# Patient Record
Sex: Female | Born: 1980
Health system: Southern US, Community
[De-identification: ages and names within clinical notes are randomized; demographics above are authoritative.]

## PROBLEM LIST (undated history)

## (undated) ENCOUNTER — Ambulatory Visit: Admission: EM

## (undated) DIAGNOSIS — F419 Anxiety disorder, unspecified: Secondary | ICD-10-CM

## (undated) DIAGNOSIS — L309 Dermatitis, unspecified: Secondary | ICD-10-CM

## (undated) DIAGNOSIS — A389 Scarlet fever, uncomplicated: Secondary | ICD-10-CM

## (undated) DIAGNOSIS — F329 Major depressive disorder, single episode, unspecified: Secondary | ICD-10-CM

## (undated) DIAGNOSIS — T7840XA Allergy, unspecified, initial encounter: Secondary | ICD-10-CM

## (undated) DIAGNOSIS — R011 Cardiac murmur, unspecified: Secondary | ICD-10-CM

## (undated) DIAGNOSIS — F4310 Post-traumatic stress disorder, unspecified: Secondary | ICD-10-CM

## (undated) DIAGNOSIS — F319 Bipolar disorder, unspecified: Secondary | ICD-10-CM

## (undated) DIAGNOSIS — F32A Depression, unspecified: Secondary | ICD-10-CM

## (undated) DIAGNOSIS — E785 Hyperlipidemia, unspecified: Secondary | ICD-10-CM

## (undated) HISTORY — DX: Allergy, unspecified, initial encounter: T78.40XA

## (undated) HISTORY — DX: Dermatitis, unspecified: L30.9

## (undated) HISTORY — DX: Cardiac murmur, unspecified: R01.1

## (undated) HISTORY — PX: WISDOM TOOTH EXTRACTION: SHX21

## (undated) HISTORY — DX: Anxiety disorder, unspecified: F41.9

## (undated) HISTORY — DX: Depression, unspecified: F32.A

## (undated) HISTORY — DX: Hyperlipidemia, unspecified: E78.5

## (undated) HISTORY — DX: Post-traumatic stress disorder, unspecified: F43.10

## (undated) HISTORY — DX: Scarlet fever, uncomplicated: A38.9

## (undated) HISTORY — DX: Bipolar disorder, unspecified: F31.9

---
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
## 2006-11-18 ENCOUNTER — Emergency Department (HOSPITAL_COMMUNITY): Admission: EM | Admit: 2006-11-18 | Discharge: 2006-11-18 | Payer: Self-pay | Admitting: Emergency Medicine

## 2007-07-06 ENCOUNTER — Emergency Department (HOSPITAL_COMMUNITY): Admission: EM | Admit: 2007-07-06 | Discharge: 2007-07-06 | Payer: Self-pay | Admitting: Emergency Medicine

## 2008-03-05 ENCOUNTER — Emergency Department (HOSPITAL_COMMUNITY): Admission: EM | Admit: 2008-03-05 | Discharge: 2008-03-06 | Payer: Self-pay | Admitting: Emergency Medicine

## 2009-02-06 ENCOUNTER — Emergency Department (HOSPITAL_COMMUNITY): Admission: EM | Admit: 2009-02-06 | Discharge: 2009-02-06 | Payer: Self-pay | Admitting: Emergency Medicine

## 2009-03-15 ENCOUNTER — Emergency Department (HOSPITAL_COMMUNITY): Admission: EM | Admit: 2009-03-15 | Discharge: 2009-03-15 | Payer: Self-pay | Admitting: Emergency Medicine

## 2009-04-18 ENCOUNTER — Emergency Department (HOSPITAL_COMMUNITY): Admission: EM | Admit: 2009-04-18 | Discharge: 2009-04-18 | Payer: Self-pay | Admitting: Emergency Medicine

## 2009-08-09 ENCOUNTER — Emergency Department (HOSPITAL_COMMUNITY): Admission: EM | Admit: 2009-08-09 | Discharge: 2009-08-09 | Payer: Self-pay | Admitting: Emergency Medicine

## 2009-08-15 ENCOUNTER — Inpatient Hospital Stay (HOSPITAL_COMMUNITY): Admission: EM | Admit: 2009-08-15 | Discharge: 2009-08-16 | Payer: Self-pay | Admitting: Emergency Medicine

## 2009-08-15 ENCOUNTER — Encounter (INDEPENDENT_AMBULATORY_CARE_PROVIDER_SITE_OTHER): Payer: Self-pay

## 2010-03-21 ENCOUNTER — Emergency Department (HOSPITAL_COMMUNITY)
Admission: EM | Admit: 2010-03-21 | Discharge: 2010-03-22 | Payer: Self-pay | Source: Home / Self Care | Admitting: Emergency Medicine

## 2010-07-24 LAB — WET PREP, GENITAL
Trich, Wet Prep: NONE SEEN
Yeast Wet Prep HPF POC: NONE SEEN

## 2010-07-24 LAB — CBC
MCH: 30 pg (ref 26.0–34.0)
MCV: 85.5 fL (ref 78.0–100.0)
Platelets: 228 10*3/uL (ref 150–400)
RDW: 13.5 % (ref 11.5–15.5)
WBC: 4.4 10*3/uL (ref 4.0–10.5)

## 2010-07-24 LAB — DIFFERENTIAL
Basophils Absolute: 0 10*3/uL (ref 0.0–0.1)
Eosinophils Absolute: 0.1 10*3/uL (ref 0.0–0.7)
Eosinophils Relative: 1 % (ref 0–5)
Lymphocytes Relative: 53 % — ABNORMAL HIGH (ref 12–46)

## 2010-07-24 LAB — URINALYSIS, ROUTINE W REFLEX MICROSCOPIC
Ketones, ur: NEGATIVE mg/dL
Nitrite: NEGATIVE
Protein, ur: NEGATIVE mg/dL

## 2010-07-24 LAB — BASIC METABOLIC PANEL
BUN: 6 mg/dL (ref 6–23)
Chloride: 107 mEq/L (ref 96–112)
Creatinine, Ser: 0.75 mg/dL (ref 0.4–1.2)

## 2010-07-24 LAB — HEPATIC FUNCTION PANEL
ALT: 11 U/L (ref 0–35)
Albumin: 3.9 g/dL (ref 3.5–5.2)
Alkaline Phosphatase: 53 U/L (ref 39–117)
Total Protein: 7 g/dL (ref 6.0–8.3)

## 2010-07-24 LAB — LIPASE, BLOOD: Lipase: 33 U/L (ref 11–59)

## 2010-08-01 LAB — DIFFERENTIAL
Basophils Absolute: 0 10*3/uL (ref 0.0–0.1)
Eosinophils Relative: 1 % (ref 0–5)
Lymphocytes Relative: 28 % (ref 12–46)
Lymphs Abs: 2.4 10*3/uL (ref 0.7–4.0)
Monocytes Absolute: 0.5 10*3/uL (ref 0.1–1.0)
Monocytes Relative: 6 % (ref 3–12)

## 2010-08-01 LAB — POCT PREGNANCY, URINE: Preg Test, Ur: NEGATIVE

## 2010-08-01 LAB — COMPREHENSIVE METABOLIC PANEL
AST: 21 U/L (ref 0–37)
Albumin: 3.8 g/dL (ref 3.5–5.2)
Chloride: 104 mEq/L (ref 96–112)
Creatinine, Ser: 0.84 mg/dL (ref 0.4–1.2)
GFR calc Af Amer: >60 mL/min (ref >60)
Total Bilirubin: 0.5 mg/dL (ref 0.3–1.2)
Total Protein: 7.2 g/dL (ref 6.0–8.3)

## 2010-08-01 LAB — GLUCOSE, CAPILLARY: Glucose-Capillary: 128 mg/dL — ABNORMAL HIGH (ref 70–99)

## 2010-08-01 LAB — URINALYSIS, ROUTINE W REFLEX MICROSCOPIC
Bilirubin Urine: NEGATIVE
Glucose, UA: NEGATIVE mg/dL
Hgb urine dipstick: NEGATIVE
Specific Gravity, Urine: 1.028 (ref 1.005–1.030)
pH: 8 (ref 5.0–8.0)

## 2010-08-01 LAB — CBC
MCV: 91.8 fL (ref 78.0–100.0)
Platelets: 219 10*3/uL (ref 150–400)
WBC: 8.5 10*3/uL (ref 4.0–10.5)

## 2010-08-01 LAB — URINE MICROSCOPIC-ADD ON

## 2010-08-05 LAB — DIFFERENTIAL
Eosinophils Absolute: 0.1 10*3/uL (ref 0.0–0.7)
Lymphocytes Relative: 56 % — ABNORMAL HIGH (ref 12–46)
Lymphs Abs: 3.1 10*3/uL (ref 0.7–4.0)
Neutrophils Relative %: 33 % — ABNORMAL LOW (ref 43–77)

## 2010-08-05 LAB — URINALYSIS, ROUTINE W REFLEX MICROSCOPIC
Bilirubin Urine: NEGATIVE
Nitrite: NEGATIVE
Specific Gravity, Urine: 1.034 — ABNORMAL HIGH (ref 1.005–1.030)
pH: 6 (ref 5.0–8.0)

## 2010-08-05 LAB — BASIC METABOLIC PANEL
BUN: 13 mg/dL (ref 6–23)
Creatinine, Ser: 0.8 mg/dL (ref 0.4–1.2)
GFR calc non Af Amer: >60 mL/min (ref >60)
Glucose, Bld: 97 mg/dL (ref 70–99)

## 2010-08-05 LAB — HEPATIC FUNCTION PANEL
ALT: 14 U/L (ref 0–35)
Albumin: 3.7 g/dL (ref 3.5–5.2)
Alkaline Phosphatase: 48 U/L (ref 39–117)
Total Protein: 7 g/dL (ref 6.0–8.3)

## 2010-08-05 LAB — LIPASE, BLOOD: Lipase: 32 U/L (ref 11–59)

## 2010-08-05 LAB — POCT PREGNANCY, URINE: Preg Test, Ur: NEGATIVE

## 2010-08-05 LAB — CBC
MCV: 91.7 fL (ref 78.0–100.0)
Platelets: 213 10*3/uL (ref 150–400)
RDW: 14.1 % (ref 11.5–15.5)
WBC: 5.5 10*3/uL (ref 4.0–10.5)

## 2010-08-14 LAB — COMPREHENSIVE METABOLIC PANEL
ALT: 18 U/L (ref 0–35)
AST: 19 U/L (ref 0–37)
Albumin: 3.8 g/dL (ref 3.5–5.2)
Alkaline Phosphatase: 43 U/L (ref 39–117)
CO2: 25 mEq/L (ref 19–32)
Chloride: 106 mEq/L (ref 96–112)
GFR calc Af Amer: >60 mL/min (ref >60)
GFR calc non Af Amer: >60 mL/min (ref >60)
Potassium: 3.7 mEq/L (ref 3.5–5.1)
Sodium: 135 mEq/L (ref 135–145)
Total Bilirubin: 0.5 mg/dL (ref 0.3–1.2)

## 2010-08-14 LAB — URINALYSIS, ROUTINE W REFLEX MICROSCOPIC
Bilirubin Urine: NEGATIVE
Glucose, UA: NEGATIVE mg/dL
Hgb urine dipstick: NEGATIVE
Protein, ur: NEGATIVE mg/dL
Urobilinogen, UA: 0.2 mg/dL (ref 0.0–1.0)

## 2010-08-14 LAB — DIFFERENTIAL
Basophils Absolute: 0 10*3/uL (ref 0.0–0.1)
Eosinophils Absolute: 0.1 10*3/uL (ref 0.0–0.7)
Eosinophils Relative: 2 % (ref 0–5)
Monocytes Absolute: 0.5 10*3/uL (ref 0.1–1.0)

## 2010-08-14 LAB — SEDIMENTATION RATE: Sed Rate: 15 mm/hr (ref 0–22)

## 2010-08-14 LAB — CBC
MCV: 91.6 fL (ref 78.0–100.0)
RBC: 4.12 MIL/uL (ref 3.87–5.11)
WBC: 5.6 10*3/uL (ref 4.0–10.5)

## 2011-02-04 LAB — POCT URINALYSIS DIP (DEVICE)
Glucose, UA: NEGATIVE
Hgb urine dipstick: NEGATIVE
Nitrite: NEGATIVE
pH: 7

## 2011-02-04 LAB — POCT PREGNANCY, URINE: Operator id: 235561

## 2011-05-14 HISTORY — PX: CHOLECYSTECTOMY: SHX55

## 2014-02-19 ENCOUNTER — Encounter (HOSPITAL_COMMUNITY): Payer: Self-pay | Admitting: Emergency Medicine

## 2014-02-19 ENCOUNTER — Emergency Department (HOSPITAL_COMMUNITY): Payer: No Typology Code available for payment source

## 2014-02-19 ENCOUNTER — Emergency Department (HOSPITAL_COMMUNITY)
Admission: EM | Admit: 2014-02-19 | Discharge: 2014-02-19 | Disposition: A | Discharge status: Home / Self Care | Payer: No Typology Code available for payment source | Attending: Emergency Medicine | Admitting: Emergency Medicine

## 2014-02-19 DIAGNOSIS — S29002A Unspecified injury of muscle and tendon of back wall of thorax, initial encounter: Secondary | ICD-10-CM | POA: Diagnosis present

## 2014-02-19 DIAGNOSIS — Z7951 Long term (current) use of inhaled steroids: Secondary | ICD-10-CM | POA: Diagnosis not present

## 2014-02-19 DIAGNOSIS — S335XXA Sprain of ligaments of lumbar spine, initial encounter: Secondary | ICD-10-CM | POA: Insufficient documentation

## 2014-02-19 DIAGNOSIS — Z3202 Encounter for pregnancy test, result negative: Secondary | ICD-10-CM | POA: Insufficient documentation

## 2014-02-19 DIAGNOSIS — Z720 Tobacco use: Secondary | ICD-10-CM | POA: Diagnosis not present

## 2014-02-19 DIAGNOSIS — Y92410 Unspecified street and highway as the place of occurrence of the external cause: Secondary | ICD-10-CM | POA: Diagnosis not present

## 2014-02-19 DIAGNOSIS — Y9389 Activity, other specified: Secondary | ICD-10-CM | POA: Insufficient documentation

## 2014-02-19 DIAGNOSIS — Z91040 Latex allergy status: Secondary | ICD-10-CM | POA: Insufficient documentation

## 2014-02-19 DIAGNOSIS — T148XXA Other injury of unspecified body region, initial encounter: Secondary | ICD-10-CM

## 2014-02-19 LAB — POC URINE PREG, ED: Preg Test, Ur: NEGATIVE

## 2014-02-19 MED ORDER — NAPROXEN 500 MG PO TABS: 500.0000 mg | ORAL_TABLET | Freq: Two times a day (BID) | ORAL | Status: DC

## 2014-02-19 MED ORDER — IBUPROFEN 800 MG PO TABS
800.0000 mg | ORAL_TABLET | Freq: Once | ORAL | Status: AC
  Administered 2014-02-19: 800 mg via ORAL
  Filled 2014-02-19: qty 1

## 2014-02-19 MED ORDER — METHOCARBAMOL 500 MG PO TABS: 500.0000 mg | ORAL_TABLET | Freq: Two times a day (BID) | ORAL | Status: DC
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
## 2014-02-19 NOTE — ED Notes (Signed)
MD at bedside. 
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
## 2014-02-19 NOTE — Discharge Instructions (Signed)
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
## 2014-06-20 ENCOUNTER — Emergency Department (HOSPITAL_COMMUNITY): Payer: BLUE CROSS/BLUE SHIELD

## 2014-06-20 ENCOUNTER — Encounter (HOSPITAL_COMMUNITY): Payer: Self-pay | Admitting: Emergency Medicine

## 2014-06-20 ENCOUNTER — Emergency Department (HOSPITAL_COMMUNITY)
Admission: EM | Admit: 2014-06-20 | Discharge: 2014-06-20 | Disposition: A | Discharge status: Home / Self Care | Payer: BLUE CROSS/BLUE SHIELD | Attending: Emergency Medicine | Admitting: Emergency Medicine

## 2014-06-20 DIAGNOSIS — R202 Paresthesia of skin: Secondary | ICD-10-CM | POA: Insufficient documentation

## 2014-06-20 DIAGNOSIS — R413 Other amnesia: Secondary | ICD-10-CM | POA: Diagnosis not present

## 2014-06-20 DIAGNOSIS — H538 Other visual disturbances: Secondary | ICD-10-CM | POA: Insufficient documentation

## 2014-06-20 DIAGNOSIS — R51 Headache: Secondary | ICD-10-CM | POA: Insufficient documentation

## 2014-06-20 DIAGNOSIS — R531 Weakness: Secondary | ICD-10-CM | POA: Insufficient documentation

## 2014-06-20 DIAGNOSIS — Z91040 Latex allergy status: Secondary | ICD-10-CM | POA: Insufficient documentation

## 2014-06-20 DIAGNOSIS — Z792 Long term (current) use of antibiotics: Secondary | ICD-10-CM | POA: Diagnosis not present

## 2014-06-20 DIAGNOSIS — R519 Headache, unspecified: Secondary | ICD-10-CM

## 2014-06-20 DIAGNOSIS — H539 Unspecified visual disturbance: Secondary | ICD-10-CM

## 2014-06-20 DIAGNOSIS — R4789 Other speech disturbances: Secondary | ICD-10-CM | POA: Insufficient documentation

## 2014-06-20 NOTE — ED Notes (Signed)
PA at bedside.

## 2014-06-20 NOTE — ED Notes (Signed)
Patient returned from [place]. Patient waiting for results.

## 2014-06-20 NOTE — Discharge Instructions (Signed)
Take Excedrin migraine daily. Follow up with neurologist. Return if worsening symptoms or do not go away.    Migraine Headache A migraine headache is an intense, throbbing pain on one or both sides of your head. A migraine can last for 30 minutes to several hours. CAUSES  The exact cause of a migraine headache is not always known. However, a migraine may be caused when nerves in the brain become irritated and release chemicals that cause inflammation. This causes pain. Certain things may also trigger migraines, such as:  Alcohol.  Smoking.  Stress.  Menstruation.  Aged cheeses.  Foods or drinks that contain nitrates, glutamate, aspartame, or tyramine.  Lack of sleep.  Chocolate.  Caffeine.  Hunger.  Physical exertion.  Fatigue.  Medicines used to treat chest pain (nitroglycerine), birth control pills, estrogen, and some blood pressure medicines. SIGNS AND SYMPTOMS  Pain on one or both sides of your head.  Pulsating or throbbing pain.  Severe pain that prevents daily activities.  Pain that is aggravated by any physical activity.  Nausea, vomiting, or both.  Dizziness.  Pain with exposure to bright lights, loud noises, or activity.  General sensitivity to bright lights, loud noises, or smells. Before you get a migraine, you may get warning signs that a migraine is coming (aura). An aura may include:  Seeing flashing lights.  Seeing bright spots, halos, or zigzag lines.  Having tunnel vision or blurred vision.  Having feelings of numbness or tingling.  Having trouble talking.  Having muscle weakness. DIAGNOSIS  A migraine headache is often diagnosed based on:  Symptoms.  Physical exam.  A CT scan or MRI of your head. These imaging tests cannot diagnose migraines, but they can help rule out other causes of headaches. TREATMENT Medicines may be given for pain and nausea. Medicines can also be given to help prevent recurrent migraines.  HOME CARE  INSTRUCTIONS  Only take over-the-counter or prescription medicines for pain or discomfort as directed by your health care provider. The use of long-term narcotics is not recommended.  Lie down in a dark, quiet room when you have a migraine.  Keep a journal to find out what may trigger your migraine headaches. For example, write down:  What you eat and drink.  How much sleep you get.  Any change to your diet or medicines.  Limit alcohol consumption.  Quit smoking if you smoke.  Get 7-9 hours of sleep, or as recommended by your health care provider.  Limit stress.  Keep lights dim if bright lights bother you and make your migraines worse. SEEK IMMEDIATE MEDICAL CARE IF:   Your migraine becomes severe.  You have a fever.  You have a stiff neck.  You have vision loss.  You have muscular weakness or loss of muscle control.  You start losing your balance or have trouble walking.  You feel faint or pass out.  You have severe symptoms that are different from your first symptoms. MAKE SURE YOU:   Understand these instructions.  Will watch your condition.  Will get help right away if you are not doing well or get worse. Document Released: 04/29/2005 Document Revised: 09/13/2013 Document Reviewed: 01/04/2013 [facility] Patient Information 2015 [employer], [place]. This information is not intended to replace advice given to you by your health care provider. Make sure you discuss any questions you have with your health care provider.
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
## 2014-06-20 NOTE — ED Notes (Signed)
Patient transported to CT 

## 2014-06-20 NOTE — ED Notes (Signed)
PA at the bedside.

## 2014-07-18 ENCOUNTER — Ambulatory Visit (INDEPENDENT_AMBULATORY_CARE_PROVIDER_SITE_OTHER): Payer: BLUE CROSS/BLUE SHIELD | Admitting: Neurology

## 2014-07-18 ENCOUNTER — Encounter: Payer: Self-pay | Admitting: Neurology

## 2014-07-18 VITALS — BP 122/72 | HR 77 | Ht 68.0 in | Wt 196.0 lb

## 2014-07-18 DIAGNOSIS — R413 Other amnesia: Secondary | ICD-10-CM | POA: Diagnosis not present

## 2014-07-18 DIAGNOSIS — R51 Headache: Secondary | ICD-10-CM | POA: Diagnosis not present

## 2014-07-18 DIAGNOSIS — R5382 Chronic fatigue, unspecified: Secondary | ICD-10-CM

## 2014-07-18 DIAGNOSIS — G441 Vascular headache, not elsewhere classified: Secondary | ICD-10-CM

## 2014-07-18 DIAGNOSIS — R635 Abnormal weight gain: Secondary | ICD-10-CM | POA: Diagnosis not present

## 2014-07-18 DIAGNOSIS — H539 Unspecified visual disturbance: Secondary | ICD-10-CM

## 2014-07-18 DIAGNOSIS — F0781 Postconcussional syndrome: Secondary | ICD-10-CM | POA: Insufficient documentation

## 2014-07-18 DIAGNOSIS — R5383 Other fatigue: Secondary | ICD-10-CM | POA: Insufficient documentation

## 2014-07-18 DIAGNOSIS — R519 Headache, unspecified: Secondary | ICD-10-CM | POA: Insufficient documentation

## 2014-07-18 MED ORDER — NORTRIPTYLINE HCL 25 MG PO CAPS: 25.0000 mg | ORAL_CAPSULE | Freq: Every day | ORAL | Status: DC
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
## 2014-07-18 NOTE — Patient Instructions (Signed)
Overall you are doing fairly well but I do want to suggest a few things today:   Remember to drink plenty of fluid, eat healthy meals and do not skip any meals. Try to eat protein with a every meal and eat a healthy snack such as fruit or nuts in between meals. Try to keep a regular sleep-wake schedule and try to exercise daily, particularly in the form of walking, 20-30 minutes a day, if you can.   As far as your medications are concerned, I would like to suggest; Nortriptyline in the evenings  As far as diagnostic testing: MRI of the brain  I would like to see you back in 3 months, sooner if we need to. Please call us with any interim questions, concerns, problems, updates or refill requests.   Please also call us for any test results so we can go over those with you on the phone.  My clinical assistant and will answer any of your questions and relay your messages to me and also relay most of my messages to you.   Our phone number is [phone]. We also have an after hours call service for urgent matters and there is a physician on-call for urgent questions. For any emergencies you know to call 911 or go to the nearest emergency room

## 2014-07-19 ENCOUNTER — Telehealth: Payer: Self-pay | Admitting: *Deleted

## 2014-07-19 LAB — COMPREHENSIVE METABOLIC PANEL
ALK PHOS: 65 IU/L (ref 39–117)
ALT: 9 IU/L (ref 0–32)
AST: 12 IU/L (ref 0–40)
Albumin/Globulin Ratio: 1.7 (ref 1.1–2.5)
Albumin: 4 g/dL (ref 3.5–5.5)
BILIRUBIN TOTAL: 0.2 mg/dL (ref 0.0–1.2)
BUN / CREAT RATIO: 14 (ref 8–20)
BUN: 10 mg/dL (ref 6–20)
CO2: 22 mmol/L (ref 18–29)
Calcium: 8.8 mg/dL (ref 8.7–10.2)
Chloride: 104 mmol/L (ref 97–108)
Creatinine, Ser: 0.72 mg/dL (ref 0.57–1.00)
GFR, EST AFRICAN AMERICAN: 127 mL/min/{1.73_m2} (ref >59)
GFR, EST NON AFRICAN AMERICAN: 110 mL/min/{1.73_m2} (ref >59)
GLOBULIN, TOTAL: 2.3 g/dL (ref 1.5–4.5)
Glucose: 91 mg/dL (ref 65–99)
Potassium: 4.6 mmol/L (ref 3.5–5.2)
SODIUM: 139 mmol/L (ref 134–144)
Total Protein: 6.3 g/dL (ref 6.0–8.5)

## 2014-07-19 LAB — THYROID PANEL WITH TSH
FREE THYROXINE INDEX: 2.2 (ref 1.2–4.9)
T3 UPTAKE RATIO: 27 % (ref 24–39)
T4, Total: 8 ug/dL (ref 4.5–12.0)
TSH: 3.54 u[IU]/mL (ref 0.450–4.500)

## 2014-07-19 NOTE — Telephone Encounter (Signed)
Talked with patient about normal lab results. Pt verbalized understanding.  

## 2014-07-20 ENCOUNTER — Ambulatory Visit (INDEPENDENT_AMBULATORY_CARE_PROVIDER_SITE_OTHER): Payer: BLUE CROSS/BLUE SHIELD

## 2014-07-20 DIAGNOSIS — H539 Unspecified visual disturbance: Secondary | ICD-10-CM | POA: Diagnosis not present

## 2014-07-20 DIAGNOSIS — R51 Headache: Secondary | ICD-10-CM

## 2014-07-20 DIAGNOSIS — R413 Other amnesia: Secondary | ICD-10-CM

## 2014-07-20 DIAGNOSIS — R519 Headache, unspecified: Secondary | ICD-10-CM

## 2014-07-25 ENCOUNTER — Telehealth: Payer: Self-pay | Admitting: Neurology

## 2014-07-25 NOTE — Telephone Encounter (Signed)
Called patient, left message. The MRI of her brain was unremarkable, we did see an incidental chiari malformation with normal upper cervical cord. Some non-specific areas of gliosis. Asked patient to call back if she wanted to discuss. Thanks.

## 2014-07-26 NOTE — Telephone Encounter (Signed)
Patient returning call regarding MRI results.  Please return call and advise.

## 2014-07-27 NOTE — Telephone Encounter (Signed)
Patient not home, left message. Will try back tomorrow.

## 2014-07-28 ENCOUNTER — Telehealth: Payer: Self-pay | Admitting: *Deleted

## 2014-07-28 NOTE — Telephone Encounter (Signed)
Talked with patient and told her Dr. Jaynee Eagles will be back in the office tomorrow and will give her a call back. Pt verbalized understanding.

## 2014-07-28 NOTE — Telephone Encounter (Signed)
Patient returning call and requested a return call before 1 pm, due to works 2nd shift.

## 2014-07-29 ENCOUNTER — Other Ambulatory Visit: Payer: Self-pay | Admitting: Neurology

## 2014-07-29 MED ORDER — NORTRIPTYLINE HCL 25 MG PO CAPS: 75.0000 mg | ORAL_CAPSULE | Freq: Every day | ORAL | Status: DC

## 2014-07-29 NOTE — Telephone Encounter (Signed)
Discussed MRi results. Will increase Pamelor to 50mg  qhs and if tolerated to 75mg  qhs.

## 2014-07-29 NOTE — Telephone Encounter (Signed)
Patient is calling back regarding MRI results.

## 2014-08-02 ENCOUNTER — Telehealth: Payer: Self-pay | Admitting: Neurology

## 2014-08-02 ENCOUNTER — Ambulatory Visit: Payer: Self-pay | Admitting: Neurology

## 2014-08-02 NOTE — Telephone Encounter (Signed)
Patient is calling Rx Nortriptyline 25 mg. She stated she was advised to up the dossage to 2 tablets at night. She stated this is making her very tired and sleepy and hard for her to function. Please call.

## 2014-08-03 ENCOUNTER — Encounter: Payer: Self-pay | Admitting: Neurology

## 2014-08-03 NOTE — Telephone Encounter (Signed)
Nicole [name], would you call patient and advise her to decrease to one pill in the evenings. If two pills is making her too tired, we can go back down to one x 25mg  at night. Thank you

## 2014-08-04 ENCOUNTER — Telehealth: Payer: Self-pay | Admitting: *Deleted

## 2014-08-04 NOTE — Telephone Encounter (Signed)
Left message for patient to call back. Gave [facility] phone number.

## 2014-08-04 NOTE — Telephone Encounter (Signed)
Left a message for the pt to call us back. Gave [facility] phone number and office hours.

## 2014-08-04 NOTE — Telephone Encounter (Signed)
Patient returning Nicole Dupont, RN's call, requesting return call @ [phone].

## 2014-08-04 NOTE — Telephone Encounter (Signed)
Talked with pt and advised her to take only one pill of the Nortriptyline in the evenings per Dr. Jaynee Eagles instructions. Pt verbalized understanding. Told pt to call back with further questions If needed

## 2014-10-18 ENCOUNTER — Ambulatory Visit: Payer: BLUE CROSS/BLUE SHIELD | Admitting: Neurology

## 2014-10-24 ENCOUNTER — Ambulatory Visit: Payer: BLUE CROSS/BLUE SHIELD | Admitting: Neurology

## 2014-10-25 ENCOUNTER — Encounter: Payer: Self-pay | Admitting: Neurology

## 2015-05-14 DIAGNOSIS — M359 Systemic involvement of connective tissue, unspecified: Secondary | ICD-10-CM

## 2015-05-14 HISTORY — DX: Systemic involvement of connective tissue, unspecified: M35.9

## 2015-06-08 ENCOUNTER — Telehealth: Payer: Self-pay | Admitting: *Deleted

## 2015-06-08 NOTE — Telephone Encounter (Signed)
Patient questionnaire form is on [employer].

## 2015-12-07 ENCOUNTER — Encounter (HOSPITAL_COMMUNITY): Payer: Self-pay | Admitting: Emergency Medicine

## 2016-09-11 DIAGNOSIS — M359 Systemic involvement of connective tissue, unspecified: Secondary | ICD-10-CM | POA: Insufficient documentation

## 2016-09-11 DIAGNOSIS — M629 Disorder of muscle, unspecified: Secondary | ICD-10-CM | POA: Insufficient documentation

## 2016-09-11 DIAGNOSIS — D649 Anemia, unspecified: Secondary | ICD-10-CM | POA: Insufficient documentation

## 2016-09-11 DIAGNOSIS — F32A Depression, unspecified: Secondary | ICD-10-CM | POA: Insufficient documentation

## 2016-09-11 DIAGNOSIS — K5900 Constipation, unspecified: Secondary | ICD-10-CM | POA: Insufficient documentation

## 2016-09-11 DIAGNOSIS — E7800 Pure hypercholesterolemia, unspecified: Secondary | ICD-10-CM | POA: Insufficient documentation

## 2017-05-13 HISTORY — PX: SHOULDER SURGERY: SHX246

## 2018-09-02 ENCOUNTER — Encounter: Payer: Self-pay | Admitting: Family Medicine

## 2018-09-02 ENCOUNTER — Telehealth (INDEPENDENT_AMBULATORY_CARE_PROVIDER_SITE_OTHER): Payer: No Typology Code available for payment source | Admitting: Family Medicine

## 2018-09-02 DIAGNOSIS — G8929 Other chronic pain: Secondary | ICD-10-CM

## 2018-09-02 DIAGNOSIS — F411 Generalized anxiety disorder: Secondary | ICD-10-CM

## 2018-09-02 DIAGNOSIS — M25511 Pain in right shoulder: Secondary | ICD-10-CM | POA: Diagnosis not present

## 2018-09-02 MED ORDER — MELOXICAM 15 MG PO TABS: 15.0000 mg | ORAL_TABLET | Freq: Every day | ORAL | 2 refills | Status: DC

## 2018-09-02 MED ORDER — ESCITALOPRAM OXALATE 10 MG PO TABS: 10.0000 mg | ORAL_TABLET | Freq: Every day | ORAL | 2 refills | Status: DC

## 2018-09-02 MED ORDER — GABAPENTIN 300 MG PO CAPS: 300.0000 mg | ORAL_CAPSULE | Freq: Three times a day (TID) | ORAL | 3 refills | Status: DC
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
## 2018-10-02 ENCOUNTER — Telehealth: Payer: No Typology Code available for payment source | Admitting: Family Medicine

## 2018-10-29 ENCOUNTER — Other Ambulatory Visit: Payer: Self-pay

## 2018-10-29 ENCOUNTER — Encounter: Payer: Self-pay | Admitting: Family Medicine

## 2018-10-29 ENCOUNTER — Ambulatory Visit (INDEPENDENT_AMBULATORY_CARE_PROVIDER_SITE_OTHER): Payer: No Typology Code available for payment source | Admitting: Family Medicine

## 2018-10-29 VITALS — BP 117/81 | HR 76 | Temp 98.9°F | Ht 67.0 in | Wt 211.0 lb

## 2018-10-29 DIAGNOSIS — M359 Systemic involvement of connective tissue, unspecified: Secondary | ICD-10-CM | POA: Diagnosis not present

## 2018-10-29 DIAGNOSIS — Z803 Family history of malignant neoplasm of breast: Secondary | ICD-10-CM | POA: Insufficient documentation

## 2018-10-29 DIAGNOSIS — M797 Fibromyalgia: Secondary | ICD-10-CM

## 2018-10-29 MED ORDER — DULOXETINE HCL 30 MG PO CPEP
30.0000 mg | ORAL_CAPSULE | Freq: Every day | ORAL | 0 refills | Status: DC
Start: 2018-10-29 — End: 2019-04-12

## 2018-10-29 MED ORDER — MELOXICAM 15 MG PO TABS: 15.0000 mg | ORAL_TABLET | Freq: Every day | ORAL | 2 refills | Status: DC

## 2018-10-29 NOTE — Progress Notes (Signed)
6/18/20203:03 PM  Nicole [name] 1980/06/25, 38 y.o., female [id number]  Chief Complaint  Patient presents with  . Joint Pain    stopped taking the gabapentin makes her sleepy, only taking the meloxicam for the pain. dx with unspecified connective tissue disease    HPI:   Patient is a 38 y.o. female with past medical history significant for GAD who presents today for followup  Telemedicine visit in April 2020 Right shoulder pain with paresthesias -started gabapentin Anxiety - started lexapro  Patient reports that gabapentin worked really well but made her really sleepy Struggling to wake up due to sedation Patient however reports that she was diagnosed at [facility] with unspecified connective tissue disorder Originally rx palquenil, stopped as she was getting lots viral infections Patient reports "feel that I have been in a fight", sore all over, taking gabapentin made body pain much better Patient reports fluctuating fatigue, mental fogginess meloxicam stopped helping  Stopped lexapro but stopped as she was not feeling as anxious anymore Overall doing better as long as able to stay at home  Depression screen [facility] 2/9 10/29/2018 09/02/2018  Decreased Interest 0 1  Down, Depressed, Hopeless 0 1  PHQ - 2 Score 0 2  Altered sleeping - 1  Tired, decreased energy - 1  Change in appetite - 0  Feeling bad or failure about yourself  - 0  Trouble concentrating - 1  Moving slowly or fidgety/restless - 1  Suicidal thoughts - 0  PHQ-9 Score - 6    Fall Risk  10/29/2018 09/02/2018  Falls in the past year? 0 0  Number falls in past yr: 0 -  Injury with Fall? 0 -     Allergies  Allergen Reactions  . Latex Hives  . Mangifera Indica Hives  . Strawberry Extract Hives  . Latex Rash    Prior to Admission medications   Medication Sig Start Date End Date Taking? Authorizing Provider  meloxicam (MOBIC) 15 MG tablet Take 1 tablet (15 mg total) by mouth daily. 09/02/18   Yes Rutherford Guys, MD    No past medical history on file.  Past Surgical History:  Procedure Laterality Date  . CHOLECYSTECTOMY  2013  . WISDOM TOOTH EXTRACTION  2013, 2016    Social History   Tobacco Use  . Smoking status: Current Some Day Smoker    Types: Cigarettes  . Smokeless tobacco: Never Used  Substance Use Topics  . Alcohol use: Yes    Alcohol/week: 0.0 standard drinks    Comment: occasional     Family History  Problem Relation Age of Onset  . Lupus Mother   . High Cholesterol Mother   . Diabetes Father     ROS Per hpi  OBJECTIVE:  Today's Vitals   10/29/18 1452  BP: 117/81  Pulse: 76  Temp: 98.9 F (37.2 C)  TempSrc: Oral  SpO2: 99%  Weight: 211 lb (95.7 kg)  Height: 5\' 7"  (1.702 m)   Body mass index is 33.05 kg/m.   Physical Exam Vitals signs and nursing note reviewed.  Constitutional:      Appearance: She is well-developed.  HENT:     Head: Normocephalic and atraumatic.     Mouth/Throat:     Pharynx: No oropharyngeal exudate.  Eyes:     General: No scleral icterus.    Conjunctiva/sclera: Conjunctivae normal.     Pupils: Pupils are equal, round, and reactive to light.  Neck:     Musculoskeletal: Neck supple.  Cardiovascular:  Rate and Rhythm: Normal rate and regular rhythm.     Heart sounds: Normal heart sounds. No murmur. No friction rub. No gallop.   Pulmonary:     Effort: Pulmonary effort is normal.     Breath sounds: Normal breath sounds. No wheezing or rales.  Musculoskeletal:     Comments: Mild synovitis and TTP Right ankle, no erythema or warmth, FROM, no other joints with synovitis TTP of right shoulder and wrist Other joints non tender multiple bilateral points of myofacial tenderness   Skin:    General: Skin is warm and dry.  Neurological:     Mental Status: She is alert and oriented to person, place, and time.    Fibro self-assesment scores: Widespread pan index: 11 out of 19 Symptom severity: 11 out of  12  ASSESSMENT and PLAN  1. Connective tissue disorder (HCC) Continue meloxicam, consider rheum referral - Sedimentation Rate - C-reactive protein  2. Fibromyalgia New diagnosis. Discussed with patient that sx suggestive of fibro, was doing well pain wise on gabapentin but too sedating, trial of duloxetine. Discussed gentle exercise.   Other orders - DULoxetine (CYMBALTA) 30 MG capsule; Take 1 capsule (30 mg total) by mouth daily. - meloxicam (MOBIC) 15 MG tablet; Take 1 tablet (15 mg total) by mouth daily.  Return in about 4 weeks (around 11/26/2018).    Rutherford Guys, MD Primary Care at [place], [place] [address] Ph.  [phone] Fax [phone]

## 2018-10-29 NOTE — Patient Instructions (Signed)
° ° ° °  If you have lab work done today you will be contacted with your lab results within the next 2 weeks.  If you have not heard from us then please contact us. The fastest way to get your results is to register for My Chart. ° ° °IF you received an x-ray today, you will receive an invoice from [facility]. Please contact [facility] at [phone] with questions or concerns regarding your invoice.  ° °IF you received labwork today, you will receive an invoice from [facility]. Please contact [facility] at [phone] with questions or concerns regarding your invoice.  ° °Our billing staff will not be able to assist you with questions regarding bills from these companies. ° °You will be contacted with the lab results as soon as they are available. The fastest way to get your results is to activate your My Chart account. Instructions are located on the last page of this paperwork. If you have not heard from us regarding the results in 2 weeks, please contact this office. °  ° ° ° °

## 2018-10-30 LAB — C-REACTIVE PROTEIN: CRP: 7 mg/L (ref 0–10)

## 2018-10-30 LAB — SEDIMENTATION RATE: Sed Rate: 45 mm/hr — ABNORMAL HIGH (ref 0–32)

## 2018-11-03 ENCOUNTER — Telehealth: Payer: Self-pay | Admitting: Family Medicine

## 2018-11-03 MED ORDER — PREDNISONE 10 MG PO TABS: ORAL_TABLET | ORAL | 0 refills | Status: AC

## 2018-11-03 NOTE — Addendum Note (Signed)
Addended by: Rutherford Guys on: 11/03/2018 02:36 PM   Modules accepted: Orders

## 2018-11-03 NOTE — Telephone Encounter (Signed)
Patient had appt with PCP on 6/18, patient was under the impression that PCP prescribed her medication"prednisone"  Patient would like to discuss further with nurse if that was not the case. Patient call back [phone]

## 2018-11-03 NOTE — Telephone Encounter (Signed)
I did not prescribe prednisone as I was waiting for her inflammatory markers to come back, which were positive. Prescription sent to her pharmacy, thanks

## 2018-11-26 ENCOUNTER — Ambulatory Visit: Payer: No Typology Code available for payment source | Admitting: Family Medicine

## 2019-04-12 ENCOUNTER — Ambulatory Visit (INDEPENDENT_AMBULATORY_CARE_PROVIDER_SITE_OTHER): Payer: No Typology Code available for payment source | Admitting: Family Medicine

## 2019-04-12 ENCOUNTER — Encounter: Payer: Self-pay | Admitting: Family Medicine

## 2019-04-12 ENCOUNTER — Other Ambulatory Visit: Payer: Self-pay

## 2019-04-12 VITALS — BP 112/81 | HR 72 | Temp 97.3°F | Ht 67.0 in | Wt 182.0 lb

## 2019-04-12 DIAGNOSIS — Z91018 Allergy to other foods: Secondary | ICD-10-CM | POA: Diagnosis not present

## 2019-04-12 NOTE — Patient Instructions (Signed)
° ° ° °  If you have lab work done today you will be contacted with your lab results within the next 2 weeks.  If you have not heard from us then please contact us. The fastest way to get your results is to register for My Chart. ° ° °IF you received an x-ray today, you will receive an invoice from [facility]. Please contact [facility] at [phone] with questions or concerns regarding your invoice.  ° °IF you received labwork today, you will receive an invoice from [facility]. Please contact [facility] at [phone] with questions or concerns regarding your invoice.  ° °Our billing staff will not be able to assist you with questions regarding bills from these companies. ° °You will be contacted with the lab results as soon as they are available. The fastest way to get your results is to activate your My Chart account. Instructions are located on the last page of this paperwork. If you have not heard from us regarding the results in 2 weeks, please contact this office. °  ° ° ° °

## 2019-04-12 NOTE — Progress Notes (Signed)
11/30/20204:28 PM  Nicole [name] 1980/12/11, 38 y.o., female [id number]  Chief Complaint  Patient presents with  . Allergic Reaction    wanting to know wverything that she should not eat, stating to have allergic reactions to foods, mostly fruit.     HPI:   Patient is a 38 y.o. female with past medical history significant for GAD and connective tissue disorder who presents today for concerns for food allergy  Strawberries cause hives and itchiness with mild SOB Recently had piece of mango and had similar issues Apples, peaches, nectarines, cherries, plums all cause itchy gums Has no issues with cooked fruit Takes benadryl for symptoms   Depression screen [facility] 2/9 04/12/2019 10/29/2018 09/02/2018  Decreased Interest 0 0 1  Down, Depressed, Hopeless 0 0 1  PHQ - 2 Score 0 0 2  Altered sleeping - - 1  Tired, decreased energy - - 1  Change in appetite - - 0  Feeling bad or failure about yourself  - - 0  Trouble concentrating - - 1  Moving slowly or fidgety/restless - - 1  Suicidal thoughts - - 0  PHQ-9 Score - - 6    Fall Risk  04/12/2019 10/29/2018 09/02/2018  Falls in the past year? 0 0 0  Number falls in past yr: 0 0 -  Injury with Fall? 0 0 -     Allergies  Allergen Reactions  . Latex Hives  . Mangifera Indica Hives  . Mango Flavor   . Strawberry Extract Hives  . Latex Rash    Prior to Admission medications   Not on File    Past Medical History:  Diagnosis Date  . Allergy     Past Surgical History:  Procedure Laterality Date  . CHOLECYSTECTOMY  2013  . WISDOM TOOTH EXTRACTION  2013, 2016    Social History   Tobacco Use  . Smoking status: Current Some Day Smoker    Types: Cigarettes  . Smokeless tobacco: Never Used  Substance Use Topics  . Alcohol use: Yes    Alcohol/week: 0.0 standard drinks    Comment: occasional     Family History  Problem Relation Age of Onset  . Lupus Mother   . High Cholesterol Mother   . Diabetes Father    . Healthy Daughter   . Healthy Son     ROS Per hpi  OBJECTIVE:  Today's Vitals   04/12/19 1607  BP: 112/81  Pulse: 72  Temp: (!) 97.3 F (36.3 C)  SpO2: 98%  Weight: 182 lb (82.6 kg)  Height: 5\' 7"  (1.702 m)   Body mass index is 28.51 kg/m.   Physical Exam Vitals signs and nursing note reviewed.  Constitutional:      Appearance: She is well-developed.  HENT:     Head: Normocephalic and atraumatic.  Eyes:     General: No scleral icterus.    Conjunctiva/sclera: Conjunctivae normal.     Pupils: Pupils are equal, round, and reactive to light.  Neck:     Musculoskeletal: Neck supple.  Pulmonary:     Effort: Pulmonary effort is normal.  Skin:    General: Skin is warm and dry.  Neurological:     Mental Status: She is alert and oriented to person, place, and time.     No results found for this or any previous visit (from the past 24 hour(s)).  No results found.   ASSESSMENT and PLAN  1. Multiple food allergies Discussed avoidance of known allergens.  - Ambulatory  referral to Allergy  No follow-ups on file.    Rutherford Guys, MD Primary Care at [place], [place] [address] Ph.  [phone] Fax [phone]

## 2019-04-30 ENCOUNTER — Encounter: Payer: Self-pay | Admitting: Allergy

## 2019-04-30 ENCOUNTER — Other Ambulatory Visit: Payer: Self-pay

## 2019-04-30 ENCOUNTER — Ambulatory Visit (INDEPENDENT_AMBULATORY_CARE_PROVIDER_SITE_OTHER): Payer: No Typology Code available for payment source | Admitting: Allergy

## 2019-04-30 VITALS — BP 116/64 | HR 86 | Temp 97.8°F | Resp 17 | Ht 65.7 in | Wt 179.4 lb

## 2019-04-30 DIAGNOSIS — J3089 Other allergic rhinitis: Secondary | ICD-10-CM

## 2019-04-30 DIAGNOSIS — T781XXD Other adverse food reactions, not elsewhere classified, subsequent encounter: Secondary | ICD-10-CM

## 2019-04-30 DIAGNOSIS — H1013 Acute atopic conjunctivitis, bilateral: Secondary | ICD-10-CM

## 2019-04-30 DIAGNOSIS — Z91040 Latex allergy status: Secondary | ICD-10-CM

## 2019-04-30 MED ORDER — EPINEPHRINE 0.3 MG/0.3ML IJ SOAJ: 0.3000 mg | INTRAMUSCULAR | 2 refills | Status: DC | PRN

## 2019-04-30 NOTE — Patient Instructions (Addendum)
Environmental allergy   - environmental allergy skin testing today is positive to positive to weed pollens, tree pollens, grass pollens, molds, dust mites, cat, cockroach.    - allergen avoidance measures discussed/handouts provided  - continue to alternate between Claritin and Zyrtec  - for nasal congestion/drainage recommend use of nasal steroid like Flonase, Rhinocort or Nasacort 2 sprays each nostril daily as needed.  Use for 1-2 weeks at a time before stopping once symptoms improve  - may benefit from nasal saline rinses to help keep nose/sinus cleaned and flush out.  Provided with rinse kit today  - for itchy/watery/red eyes can use over-the-counter Pataday 1 drop each eye daily as needed  - allergen immunotherapy discussed today including protocol, benefits and risk.  Informational handout provided.  If interested in this therapuetic option you can check with your insurance carrier for coverage.  Let us know if you would like to proceed with this option.   There can be a benefit with immunotherapy if no longer pollen allergic then you should be able to tolerate fruits/nuts without symptoms (as this is related to pollen as below).   Pollen food allergy syndrome  - The oral allergy syndrome (OAS) or pollen-food allergy syndrome (PFAS) is a relatively common form of food allergy, particularly in adults. It typically occurs in people who have pollen allergies when the immune system "sees" proteins on the food that look like proteins on the pollen. This results in the allergy antibody (IgE) binding to the food instead of the pollen. Patients typically report itching and/or mild swelling of the mouth and throat immediately following ingestion of certain uncooked fruits (including nuts) or raw vegetables. Only a very small number of affected individuals experience systemic allergic reactions, such as anaphylaxis which occurs with true food allergies.      - recommend having access to epinephrine device in  case of severe allergic reaction.   Follow emergency action plan.   She chart below  Latex allergy   - continue to avoid products containing latex  - as above recommend having access to epinephrine device   Follow-up 4-6 months or sooner if needed

## 2019-04-30 NOTE — Progress Notes (Signed)
New Patient Note  RE: Nicole [name] MRN: [id number] DOB: 1981-04-22 Date of Office Visit: 04/30/2019  Referring provider: Rutherford Guys, MD Primary care provider: Rutherford Guys, MD  Chief Complaint: possible food allergy  History of present illness: Nicole [name] is a 38 y.o. female presenting today for consultation for food allergy.    She states she has always had issues with fruits as a child.  However symptoms have progressed recently.   With recent ingestion of strawberries reports gum and throat itch and also developed shortness of breath/difficulty breathing.  Symptoms develop within minutes.  She reports same symptoms occurred with mango ingestion several weeks ago.   She also notes symptoms following pears, nectarines, peaches, apple and tree nuts.  She eats peanuts without issue.  She states she is ok to eat watermelon but doesn't like other melons.     She does report itchy eyes, throat and face as well as runny nose with cough, sneezing and ear fullness worse in spring and fall.  She alternates each year between claritin and zyrtec.  Reports using a nasal spray during pregnancy but has not used outside of pregnancy.  Has not used eyedrops.    Denies asthma.  Does report history of eczema as a child.    Review of systems: Review of Systems  Constitutional: Negative for chills, fever and malaise/fatigue.  HENT: Negative for congestion, ear discharge, nosebleeds, sinus pain and sore throat.   Eyes: Negative for pain, discharge and redness.  Respiratory: Negative.   Cardiovascular: Negative.   Gastrointestinal: Negative.   Musculoskeletal: Negative.   Skin: Negative for itching and rash.  Neurological: Negative.     All other systems negative unless noted above in HPI  Past medical history: Past Medical History:  Diagnosis Date  . Allergy   . Eczema     Past surgical history: Past Surgical History:  Procedure Laterality Date  .  CHOLECYSTECTOMY  2013  . SHOULDER SURGERY  2019   bone spur  . WISDOM TOOTH EXTRACTION  2013, 2016    Family history:  Family History  Problem Relation Age of Onset  . Lupus Mother   . High Cholesterol Mother   . Diabetes Father   . Healthy Daughter   . Healthy Son     Social history: Lives in a home without carpeting with gas heating and central cooling.  Dogs and cats in the home.  No concern for water damage, mildew or roaches in the home.  Works as a Dance movement psychotherapist.   Tobacco Use  . Smoking status: Current Some Day Smoker    Types: Cigarettes  . Smokeless tobacco: Never Used    Medication List: Current Outpatient Medications  Medication Sig Dispense Refill  . UNABLE TO FIND Med Name: clindamycin 1%, tretinoin .01%, Azelaic acid 4%, facial combination cream    . EPINEPHrine (AUVI-Q) 0.3 mg/0.3 mL IJ SOAJ injection Inject 0.3 mLs (0.3 mg total) into the muscle as needed for anaphylaxis. 2 each 2   No current facility-administered medications for this visit.    Known medication allergies: Allergies  Allergen Reactions  . Latex Hives  . Mangifera Indica Hives  . Mango Flavor   . Strawberry Extract Hives  . Latex Rash   Physical examination: Blood pressure 116/64, pulse 86, temperature 97.8 F (36.6 C), temperature source Temporal, resp. rate 17, height 5' 5.7" (1.669 m), weight 179 lb 6.4 oz (81.4 kg), last menstrual period 04/06/2019, SpO2 100 %.  General: Alert,  interactive, in no acute distress. HEENT: PERRLA, TMs pearly gray, turbinates moderately edematous without discharge, post-pharynx non erythematous. Neck: Supple without lymphadenopathy. Lungs: Clear to auscultation without wheezing, rhonchi or rales. {no increased work of breathing. CV: Normal S1, S2 without murmurs. Abdomen: Nondistended, nontender. Skin: Warm and dry, without lesions or rashes. Extremities:  No clubbing, cyanosis or edema. Neuro:   Grossly  intact.  Diagnositics/Labs:  Allergy testing: environmental allergy skin prick testing is positive to grass pollens, weed pollens, tree pollens, molds, dust mites, cat, cockroach.  Select food allergy skin prick testing is positive to almond.  Allergy testing results were read and interpreted by provider, documented by clinical staff.   Assessment and plan: Allergic rhinitis with conjunctivitis  - environmental allergy skin testing today is positive to positive to weed pollens, tree pollens, grass pollens, molds, dust mites, cat, cockroach.    - allergen avoidance measures discussed/handouts provided  - continue to alternate between Claritin and Zyrtec  - for nasal congestion/drainage recommend use of nasal steroid like Flonase, Rhinocort or Nasacort 2 sprays each nostril daily as needed.  Use for 1-2 weeks at a time before stopping once symptoms improve  - may benefit from nasal saline rinses to help keep nose/sinus cleaned and flush out.  Provided with rinse kit today  - for itchy/watery/red eyes can use over-the-counter Pataday 1 drop each eye daily as needed  - allergen immunotherapy discussed today including protocol, benefits and risk.  Informational handout provided.  If interested in this therapuetic option you can check with your insurance carrier for coverage.  Let us know if you would like to proceed with this option.   There can be a benefit with immunotherapy if no longer pollen allergic then you should be able to tolerate fruits/nuts without symptoms (as this is related to pollen as below).   Pollen food allergy syndrome  - The oral allergy syndrome (OAS) or pollen-food allergy syndrome (PFAS) is a relatively common form of food allergy, particularly in adults. It typically occurs in people who have pollen allergies when the immune system "sees" proteins on the food that look like proteins on the pollen. This results in the allergy antibody (IgE) binding to the food instead of the  pollen. Patients typically report itching and/or mild swelling of the mouth and throat immediately following ingestion of certain uncooked fruits (including nuts) or raw vegetables. Only a very small number of affected individuals experience systemic allergic reactions, such as anaphylaxis which occurs with true food allergies.     - almond skin testing is positive and would avoid along with other fruits and nuts leading to oral symptoms.   - recommend having access to epinephrine device in case of severe allergic reaction.   Follow emergency action plan.   See chart below  Latex allergy   - continue to avoid products containing latex  - as above recommend having access to epinephrine device   Follow-up 4-6 months or sooner if needed      I appreciate the opportunity to take part in Nicole [name]'s care. Please do not hesitate to contact me with questions.  Sincerely,   Prudy Feeler, MD Allergy/Immunology [facility] [place]

## 2019-07-26 ENCOUNTER — Other Ambulatory Visit: Payer: Self-pay

## 2019-07-26 ENCOUNTER — Encounter: Payer: Self-pay | Admitting: Family Medicine

## 2019-07-26 ENCOUNTER — Telehealth (INDEPENDENT_AMBULATORY_CARE_PROVIDER_SITE_OTHER): Payer: No Typology Code available for payment source | Admitting: Family Medicine

## 2019-07-26 VITALS — Temp 98.6°F | Ht 66.0 in | Wt 173.6 lb

## 2019-07-26 DIAGNOSIS — L659 Nonscarring hair loss, unspecified: Secondary | ICD-10-CM | POA: Diagnosis not present

## 2019-07-26 DIAGNOSIS — R197 Diarrhea, unspecified: Secondary | ICD-10-CM

## 2019-07-26 DIAGNOSIS — K121 Other forms of stomatitis: Secondary | ICD-10-CM

## 2019-07-26 DIAGNOSIS — M359 Systemic involvement of connective tissue, unspecified: Secondary | ICD-10-CM

## 2019-07-26 DIAGNOSIS — R634 Abnormal weight loss: Secondary | ICD-10-CM

## 2019-07-26 MED ORDER — PREDNISONE 10 MG (21) PO TBPK: ORAL_TABLET | ORAL | 0 refills | Status: DC

## 2019-07-26 NOTE — Progress Notes (Signed)
HA, Fatigue, Body aches. Going on 2-3 weeks   Hx of autoimmune and thought it would go away   Loss of hair for last couple of weeks   Losing weight going on a couple of months   Has not been out the house since mid feb.

## 2019-07-26 NOTE — Progress Notes (Signed)
Virtual Visit Note  I connected with patient on 07/26/19 at 535pm by video epic and verified that I am speaking with the correct person using two identifiers. Nicole [name] is currently located at home and patient is currently with them during visit. The provider, Rutherford Guys, MD is located in their office at time of visit.  I discussed the limitations, risks, security and privacy concerns of performing an evaluation and management service by telephone and the availability of in person appointments. I also discussed with the patient that there may be a patient responsible charge related to this service. The patient expressed understanding and agreed to proceed.   I provided 22 minutes of non-face-to-face time during this encounter.  CC: autoimmune disorder  HPI Patient with ? 2-3 weeks of mouth ulcers, very dry mouth, joint pain (swollen but not red or warm), fatigue, headaches, brain fog, intermittent diarrhea, hair loss with scabs, weight loss Seen in the past by rheum for connective tissue disorder Very strong fhx lupus FMLA intermittent, [employer] for [employer] 2-3 weeks about 2-3 times a year   Allergies  Allergen Reactions  . Latex Hives  . Mangifera Indica Hives  . Mango Flavor   . Strawberry Extract Hives  . Latex Rash    Prior to Admission medications   Medication Sig Start Date End Date Taking? Authorizing Provider  EPINEPHrine (AUVI-Q) 0.3 mg/0.3 mL IJ SOAJ injection Inject 0.3 mLs (0.3 mg total) into the muscle as needed for anaphylaxis. 04/30/19  Yes Padgett, Rae Halsted, MD  UNABLE TO FIND Med Name: clindamycin 1%, tretinoin .01%, Azelaic acid 4%, facial combination cream   Yes [provider]    Past Medical History:  Diagnosis Date  . Allergy   . Eczema     Past Surgical History:  Procedure Laterality Date  . CHOLECYSTECTOMY  2013  . SHOULDER SURGERY  2019   bone spur  . WISDOM TOOTH EXTRACTION  2013, 2016     Social History   Tobacco Use  . Smoking status: Current Some Day Smoker    Packs/day: 0.25    Years: 10.00    Pack years: 2.50    Types: Cigarettes  . Smokeless tobacco: Never Used  Substance Use Topics  . Alcohol use: Yes    Alcohol/week: 0.0 standard drinks    Comment: occasional     Family History  Problem Relation Age of Onset  . Lupus Mother   . High Cholesterol Mother   . Diabetes Father   . Healthy Daughter   . Healthy Son     Review of Systems  Constitutional: Positive for malaise/fatigue and weight loss. Negative for chills, diaphoresis and fever.  Respiratory: Positive for shortness of breath (pleuritic ). Negative for cough and wheezing.   Cardiovascular: Negative for chest pain, palpitations and leg swelling.  Gastrointestinal: Positive for diarrhea. Negative for abdominal pain, blood in stool, constipation, heartburn, melena, nausea and vomiting.  Genitourinary: Negative for frequency and urgency.  Musculoskeletal: Positive for joint pain.  Skin: Positive for rash.  Neurological: Positive for headaches. Negative for dizziness and tingling.  Endo/Heme/Allergies: Negative for polydipsia.    Objective  Vitals as reported by the patient: none  GEN: AAOx3, NAD HEENT: [place]/AT, pupils are symmetrical, EOMI, non-icteric sclera Resp: breathing comfortably, speaking in full sentences Skin: no rashes noted, no pallor Psych: good eye contact, normal mood and affect   ASSESSMENT and PLAN  1. Connective tissue disorder ([place]) Refer to rheum once labs results obtained. pred  pak ordered Will complete intermittent FMLA forms - CBC; Future - Comprehensive metabolic panel; Future - Urinalysis, Routine w reflex microscopic; Future - Sedimentation Rate; Future - C-reactive protein; Future - ANA W/Rfx to all if Positive; Future - Rheumatoid factor; Future  2. Alopecia - TSH; Future  3. Diarrhea, unspecified type 4. Mouth ulcers 5. Weight loss - amb ref GI   Other orders - predniSONE (STERAPRED UNI-PAK 21 TAB) 10 MG (21) TBPK tablet; Take as instructed on pak  FOLLOW-UP: 6 weeks   The above assessment and management plan was discussed with the patient. The patient verbalized understanding of and has agreed to the management plan. Patient is aware to call the clinic if symptoms persist or worsen. Patient is aware when to return to the clinic for a follow-up visit. Patient educated on when it is appropriate to go to the emergency department.     Rutherford Guys, MD Primary Care at [facility] [place], [place] [address] Ph.  [phone] Fax [phone]

## 2019-07-26 NOTE — Patient Instructions (Signed)
° ° ° °  If you have lab work done today you will be contacted with your lab results within the next 2 weeks.  If you have not heard from us then please contact us. The fastest way to get your results is to register for My Chart. ° ° °IF you received an x-ray today, you will receive an invoice from [facility]. Please contact [facility] at [phone] with questions or concerns regarding your invoice.  ° °IF you received labwork today, you will receive an invoice from [facility]. Please contact [facility] at [phone] with questions or concerns regarding your invoice.  ° °Our billing staff will not be able to assist you with questions regarding bills from these companies. ° °You will be contacted with the lab results as soon as they are available. The fastest way to get your results is to activate your My Chart account. Instructions are located on the last page of this paperwork. If you have not heard from us regarding the results in 2 weeks, please contact this office. °  ° ° ° °

## 2019-07-27 ENCOUNTER — Other Ambulatory Visit: Payer: Self-pay

## 2019-07-27 ENCOUNTER — Ambulatory Visit: Payer: No Typology Code available for payment source

## 2019-07-27 ENCOUNTER — Telehealth: Payer: Self-pay | Admitting: Family Medicine

## 2019-07-27 DIAGNOSIS — M359 Systemic involvement of connective tissue, unspecified: Secondary | ICD-10-CM

## 2019-07-27 DIAGNOSIS — L659 Nonscarring hair loss, unspecified: Secondary | ICD-10-CM

## 2019-07-27 NOTE — Telephone Encounter (Signed)
Pt has dropped off FMLA  Forms to be filled out, placed in the cma box

## 2019-07-27 NOTE — Telephone Encounter (Signed)
Pt is calling back was called by office but no message was left. [phone] Please advise.

## 2019-07-28 LAB — CBC
Hematocrit: 40 % (ref 34.0–46.6)
Hemoglobin: 13.1 g/dL (ref 11.1–15.9)
MCH: 29.3 pg (ref 26.6–33.0)
MCHC: 32.8 g/dL (ref 31.5–35.7)
MCV: 90 fL (ref 79–97)
Platelets: 314 10*3/uL (ref 150–450)
RBC: 4.47 x10E6/uL (ref 3.77–5.28)
RDW: 14.3 % (ref 11.7–15.4)
WBC: 4.9 10*3/uL (ref 3.4–10.8)

## 2019-07-28 LAB — COMPREHENSIVE METABOLIC PANEL
ALT: 9 IU/L (ref 0–32)
AST: 16 IU/L (ref 0–40)
Albumin/Globulin Ratio: 1.6 (ref 1.2–2.2)
Albumin: 4.2 g/dL (ref 3.8–4.8)
Alkaline Phosphatase: 76 IU/L (ref 39–117)
BUN/Creatinine Ratio: 10 (ref 9–23)
BUN: 7 mg/dL (ref 6–20)
Bilirubin Total: 0.4 mg/dL (ref 0.0–1.2)
CO2: 22 mmol/L (ref 20–29)
Calcium: 9 mg/dL (ref 8.7–10.2)
Chloride: 104 mmol/L (ref 96–106)
Creatinine, Ser: 0.68 mg/dL (ref 0.57–1.00)
GFR calc Af Amer: 128 mL/min/{1.73_m2} (ref >59)
GFR calc non Af Amer: 111 mL/min/{1.73_m2} (ref >59)
Globulin, Total: 2.6 g/dL (ref 1.5–4.5)
Glucose: 85 mg/dL (ref 65–99)
Potassium: 4.3 mmol/L (ref 3.5–5.2)
Sodium: 139 mmol/L (ref 134–144)
Total Protein: 6.8 g/dL (ref 6.0–8.5)

## 2019-07-28 LAB — ANA COMPREHENSIVE PLUS PROFILE
Anti JO-1: <0.2 AI (ref 0.0–0.9)
Antiribosomal P Antibodies: <0.2 AI (ref 0.0–0.9)
Centromere Ab Screen: <0.2 AI (ref 0.0–0.9)
Chromatin Ab SerPl-aCnc: <0.2 AI (ref 0.0–0.9)
ENA RNP Ab: 0.3 AI (ref 0.0–0.9)
ENA SM Ab Ser-aCnc: <0.2 AI (ref 0.0–0.9)
ENA SSA (RO) Ab: >8 AI — ABNORMAL HIGH (ref 0.0–0.9)
ENA SSB (LA) Ab: <0.2 AI (ref 0.0–0.9)
Scleroderma (Scl-70) (ENA) Antibody, IgG: <0.2 AI (ref 0.0–0.9)
Smith/RNP Antibodies: <0.2 AI (ref 0.0–0.9)
dsDNA Ab: 1 IU/mL (ref 0–9)

## 2019-07-28 LAB — URINALYSIS, ROUTINE W REFLEX MICROSCOPIC
Bilirubin, UA: NEGATIVE
Glucose, UA: NEGATIVE
Ketones, UA: NEGATIVE
Leukocytes,UA: NEGATIVE
Nitrite, UA: NEGATIVE
Protein,UA: NEGATIVE
RBC, UA: NEGATIVE
Specific Gravity, UA: 1.025 (ref 1.005–1.030)
Urobilinogen, Ur: 0.2 mg/dL (ref 0.2–1.0)
pH, UA: 5.5 (ref 5.0–7.5)

## 2019-07-28 LAB — C-REACTIVE PROTEIN: CRP: 6 mg/L (ref 0–10)

## 2019-07-28 LAB — ANA W/RFX TO ALL IF POSITIVE: Anti Nuclear Antibody (ANA): POSITIVE — AB

## 2019-07-28 LAB — RHEUMATOID FACTOR: Rheumatoid fact SerPl-aCnc: <10 IU/mL (ref 0.0–13.9)

## 2019-07-28 LAB — TSH: TSH: 1.8 u[IU]/mL (ref 0.450–4.500)

## 2019-07-28 LAB — SEDIMENTATION RATE: Sed Rate: 26 mm/hr (ref 0–32)

## 2019-07-29 ENCOUNTER — Ambulatory Visit: Payer: No Typology Code available for payment source

## 2019-08-03 ENCOUNTER — Telehealth: Payer: Self-pay

## 2019-08-03 NOTE — Telephone Encounter (Signed)
fmla forms faxed to [id number] [employer]

## 2019-08-03 NOTE — Addendum Note (Signed)
Addended by: Rutherford Guys on: 08/03/2019 01:26 PM   Modules accepted: Orders

## 2019-08-04 ENCOUNTER — Encounter: Payer: Self-pay | Admitting: Gastroenterology

## 2019-08-04 ENCOUNTER — Ambulatory Visit (INDEPENDENT_AMBULATORY_CARE_PROVIDER_SITE_OTHER): Payer: No Typology Code available for payment source | Admitting: Gastroenterology

## 2019-08-04 ENCOUNTER — Other Ambulatory Visit (INDEPENDENT_AMBULATORY_CARE_PROVIDER_SITE_OTHER): Payer: No Typology Code available for payment source

## 2019-08-04 VITALS — BP 178/100 | HR 66 | Temp 97.0°F | Ht 66.5 in | Wt 170.0 lb

## 2019-08-04 DIAGNOSIS — R197 Diarrhea, unspecified: Secondary | ICD-10-CM

## 2019-08-04 DIAGNOSIS — R634 Abnormal weight loss: Secondary | ICD-10-CM

## 2019-08-04 DIAGNOSIS — R110 Nausea: Secondary | ICD-10-CM | POA: Insufficient documentation

## 2019-08-04 DIAGNOSIS — Z01818 Encounter for other preprocedural examination: Secondary | ICD-10-CM

## 2019-08-04 LAB — IGA: IgA: 139 mg/dL (ref 68–378)

## 2019-08-04 MED ORDER — NA SULFATE-K SULFATE-MG SULF 17.5-3.13-1.6 GM/177ML PO SOLN: 1.0000 | Freq: Once | ORAL | 0 refills | Status: DC

## 2019-08-04 MED ORDER — NA SULFATE-K SULFATE-MG SULF 17.5-3.13-1.6 GM/177ML PO SOLN: 1.0000 | Freq: Once | ORAL | 0 refills | Status: AC

## 2019-08-04 NOTE — Patient Instructions (Signed)
If you are age 40 or older, your body mass index should be between 23-30. Your Body mass index is 27.03 kg/m. If this is out of the aforementioned range listed, please consider follow up with your Primary Care Provider.  If you are age 32 or younger, your body mass index should be between 19-25. Your Body mass index is 27.03 kg/m. If this is out of the aformentioned range listed, please consider follow up with your Primary Care Provider.   Your provider has requested that you go to the basement level for lab work before leaving today. Press "B" on the elevator. The lab is located at the first door on the left as you exit the elevator.   May use Imodium as needed.  You have been scheduled for an endoscopy and colonoscopy. Please follow the written instructions given to you at your visit today. Please pick up your prep supplies at the pharmacy within the next 1-3 days. If you use inhalers (even only as needed), please bring them with you on the day of your procedure. Your physician has requested that you go to www.startemmi.com and enter the access code given to you at your visit today. This web site gives a general overview about your procedure. However, you should still follow specific instructions given to you by our office regarding your preparation for the procedure.

## 2019-08-04 NOTE — Progress Notes (Signed)
08/04/2019 Nicole [name] [id number] [birth year]   HISTORY OF PRESENT ILLNESS: This is a pleasant 39 year old female who is new to our office.  She has been referred here by her PCP, Dr. Pamella Pert, for evaluation regarding weight loss, diarrhea, nausea.  She tells me that she has lost about 40 pounds in the past 6 months, since September.  She says that she has frequent nausea and abdominal bloating.  She reports diarrhea intermittently over the past month or more.  She says it does not occur every day, but usually 2 or 3 days out of the week she will have diarrhea all day.  She reports generalized abdominal cramping with diarrhea, but no constant abdominal pain.  She denies vomiting, fevers, chills.  She describes the nausea as a feeling of "sour stomach or queasiness".  She denies any rectal bleeding.  She tells me that she was diagnosed with unspecified connective tissue disorder and possible Sjogren's.  She reports that she gets mouth ulcers and contributed that to the Sjogren's.  She does not have any mouth ulcers currently.  She has not had any imaging of her abdomen.  Extensive lab evaluation including TSH, CBC, CMP, sed rate, and CRP were normal just last week.  She is currently on a prednisone pack to help with complaints of joint pain.  Back in June 2020 her sed rate was slightly elevated at 45, but CRP was normal at that time.  She tells me that she has not used anything for her diarrhea at this point.  She does not have a gallbladder.  She says that when she initially had her gallbladder out she had a lot of issues with diarrhea, but then they seemed to resolve.   Past Medical History:  Diagnosis Date  . Allergy   . Connective tissue disorder ([place]) 2017  . Eczema    Past Surgical History:  Procedure Laterality Date  . CHOLECYSTECTOMY  2013  . SHOULDER SURGERY  2019   bone spur  . WISDOM TOOTH EXTRACTION  2013, 2016    reports that she has been smoking cigarettes. She has  a 2.50 pack-year smoking history. She has never used smokeless tobacco. She reports current alcohol use. She reports that she does not use drugs. family history includes Diabetes in her father; Healthy in her daughter and son; High Cholesterol in her mother; Lupus in her mother. Allergies  Allergen Reactions  . Latex Hives  . Mangifera Indica Hives  . Mango Flavor   . Strawberry Extract Hives  . Latex Rash      Outpatient Encounter Medications as of 08/04/2019  Medication Sig  . EPINEPHrine (AUVI-Q) 0.3 mg/0.3 mL IJ SOAJ injection Inject 0.3 mLs (0.3 mg total) into the muscle as needed for anaphylaxis.  . predniSONE (STERAPRED UNI-PAK 21 TAB) 10 MG (21) TBPK tablet Take as instructed on pak  . UNABLE TO FIND Med Name: clindamycin 1%, tretinoin .01%, Azelaic acid 4%, facial combination cream   No facility-administered encounter medications on file as of 08/04/2019.     REVIEW OF SYSTEMS  : All other systems reviewed and negative except where noted in the History of Present Illness.   PHYSICAL EXAM: BP 108/72 (BP Location: Left Arm, Patient Position: Sitting, Cuff Size: Normal)   Pulse 66   Temp (!) 97 F (36.1 C) (Other (Comment))   Ht 5' 6.5" (1.689 m)   Wt 170 lb (77.1 kg)   LMP 07/25/2019 (Exact Date)   BMI 27.03 kg/m  General:  Well developed AA female in no acute distress Head: Normocephalic and atraumatic Eyes:  Sclerae anicteric, conjunctiva pink. Ears: Normal auditory acuity Lungs: Clear throughout to auscultation; no increased WOB. Heart: Regular rate and rhythm; no M/R/G. Abdomen: Soft, non-distended.  BS present.  Mild diffuse TTP. Rectal:  Will be done at the time of colonoscopy. Musculoskeletal: Symmetrical with no gross deformities  Skin: No lesions on visible extremities Extremities: No edema  Neurological: Alert oriented x 4, grossly non-focal Psychological:  Alert and cooperative. Normal mood and affect  ASSESSMENT AND PLAN: *39 year old female here for  evaluation of weight loss, 40 pounds in the past 6 months.  Also reports recent diarrhea, not daily, but multiple times a day several days a week, and nausea.  Diagnosed with unspecified connective tissue disorder, with possible Sjogren's.  Reports mouth sores, none currently, but contributed that to her possible Sjogren's.  Extensive lab evaluation has been unremarkable.  Recent sed rate and CRP normal.  Sed rate in June 2020 slightly elevated at 45, but CRP was normal.  We will plan for both EGD and colonoscopy for evaluation of the weight loss and diarrhea.  Need to rule out inflammatory bowel disease, etc.  Question if her diarrhea could be related to her postcholecystectomy state and/or irritable bowel if endoscopic evaluation proves negative.  Likely should also have cross-sectional imaging of her abdomen/pelvis as well once again especially if endoscopic procedures are unremarkable.  Can use Imodium as needed for now.  We will schedule EGD and colonoscopy with Dr. Loletha Carrow.  The risks, benefits, and alternatives to EGD and colonoscopy were discussed with the patient and she consents to proceed.  I think it is probably low yield, but we will check celiac labs to rule that out as well.  Sounds like she probably has a component of lactose intolerance, but she reports that dairy products tend to cause her to be constipated, and she says that she tends to avoid them anyways.   CC:  Rutherford Guys, MD

## 2019-08-05 ENCOUNTER — Telehealth (INDEPENDENT_AMBULATORY_CARE_PROVIDER_SITE_OTHER): Payer: No Typology Code available for payment source | Admitting: Family Medicine

## 2019-08-05 ENCOUNTER — Other Ambulatory Visit: Payer: Self-pay

## 2019-08-05 DIAGNOSIS — M359 Systemic involvement of connective tissue, unspecified: Secondary | ICD-10-CM | POA: Diagnosis not present

## 2019-08-05 DIAGNOSIS — R5383 Other fatigue: Secondary | ICD-10-CM

## 2019-08-05 NOTE — Progress Notes (Signed)
Virtual Visit Note  I connected with patient on 08/05/19 at 506pm by video epic and verified that I am speaking with the correct person using two identifiers. Nicole [name] is currently located at home and patient is currently with them during visit. The provider, Rutherford Guys, MD is located in their office at time of visit.  I discussed the limitations, risks, security and privacy concerns of performing an evaluation and management service by telephone and the availability of in person appointments. I also discussed with the patient that there may be a patient responsible charge related to this service. The patient expressed understanding and agreed to proceed.   I provided 16 minutes of non-face-to-face time during this encounter.  CC: headache, fatigue, body aches  HPI ? Had telemedicine visit 10 days ago Patient thought she was having flare up of connective tissue disorder, started pred pak, referred to rheum, FMLA forms done Because also having weight loss, diarrhea and mouth ulcers referred to GI whom she saw yesterday - plan for EGD/colonscopy, celiac test done, immodium prn Labs + ANA, no titer given, + SSA ab, normal RF, CBC, ESR, CRP, UA, CMP, TSH  Not as sore as prior but still very tired She does snore but no witnessed apnea, no insomnia or excessive daytime sleepiness Denies depression She got her first covid vaccine last week  Got called from rheum office for appt  Allergies  Allergen Reactions  . Latex Hives  . Mangifera Indica Hives  . Mango Flavor   . Strawberry Extract Hives  . Latex Rash    Prior to Admission medications   Medication Sig Start Date End Date Taking? Authorizing Provider  SUPREP BOWEL PREP KIT 17.5-3.13-1.6 GM/177ML SOLN [id number] Milliliter(s) By Mouth As Directed 08/05/19   [provider]    Past Medical History:  Diagnosis Date  . Allergy   . Connective tissue disorder ([place]) 2017  . Eczema     Past Surgical  History:  Procedure Laterality Date  . CHOLECYSTECTOMY  2013  . SHOULDER SURGERY  2019   bone spur  . WISDOM TOOTH EXTRACTION  2013, 2016    Social History   Tobacco Use  . Smoking status: Current Some Day Smoker    Packs/day: 0.25    Years: 10.00    Pack years: 2.50    Types: Cigarettes  . Smokeless tobacco: Never Used  Substance Use Topics  . Alcohol use: Yes    Alcohol/week: 0.0 standard drinks    Comment: occasional     Family History  Problem Relation Age of Onset  . Lupus Mother   . High Cholesterol Mother   . Diabetes Father   . Healthy Daughter   . Healthy Son     ROS Per hpi  Objective  Vitals as reported by the patient: none  GEN: AAOx3, NAD HEENT: [place]/AT, pupils are symmetrical, EOMI, non-icteric sclera Resp: breathing comfortably, speaking in full sentences Skin: no rashes noted, no pallor Psych: good eye contact, normal mood and affect   ASSESSMENT and PLAN  1. Fatigue, unspecified type 2. Connective tissue disorder ([place]) Pending rheum and GI eval  Other orders   FOLLOW-UP: prn   The above assessment and management plan was discussed with the patient. The patient verbalized understanding of and has agreed to the management plan. Patient is aware to call the clinic if symptoms persist or worsen. Patient is aware when to return to the clinic for a follow-up visit. Patient educated on when it is  appropriate to go to the emergency department.     Rutherford Guys, MD Primary Care at [facility] [place], [place] [address] Ph.  [phone] Fax [phone]

## 2019-08-06 LAB — TISSUE TRANSGLUTAMINASE ABS,IGG,IGA
(tTG) Ab, IgA: 1 U/mL
(tTG) Ab, IgG: 2 U/mL

## 2019-08-06 NOTE — Progress Notes (Signed)
____________________________________________________________  Attending physician addendum:  Thank you for sending this case to me. I have reviewed the entire note, and the outlined plan seems appropriate.  With this reported weight loss, albumin remains normal.  Also not entirely clear if the reported degree of weight loss is entirely accurate.  Accounting for difference in scales at various medical offices, she was 182 pounds at primary care visit November 2020, 179 pounds at allergy clinic visit December 2020 and 170 pounds in our office this week.  Wilfrid Lund, MD  ____________________________________________________________

## 2019-08-20 ENCOUNTER — Other Ambulatory Visit: Payer: Self-pay

## 2019-08-20 ENCOUNTER — Other Ambulatory Visit: Payer: Self-pay | Admitting: Gastroenterology

## 2019-08-20 ENCOUNTER — Encounter: Payer: Self-pay | Admitting: Family Medicine

## 2019-08-20 ENCOUNTER — Ambulatory Visit (INDEPENDENT_AMBULATORY_CARE_PROVIDER_SITE_OTHER): Payer: PRIVATE HEALTH INSURANCE

## 2019-08-20 DIAGNOSIS — Z1159 Encounter for screening for other viral diseases: Secondary | ICD-10-CM

## 2019-08-21 LAB — SARS CORONAVIRUS 2 (TAT 6-24 HRS): SARS Coronavirus 2: NEGATIVE

## 2019-08-24 ENCOUNTER — Other Ambulatory Visit: Payer: Self-pay

## 2019-08-24 ENCOUNTER — Encounter: Payer: Self-pay | Admitting: Gastroenterology

## 2019-08-24 ENCOUNTER — Ambulatory Visit (AMBULATORY_SURGERY_CENTER): Payer: 59 | Admitting: Gastroenterology

## 2019-08-24 VITALS — BP 113/74 | HR 58 | Temp 97.1°F | Resp 22 | Ht 66.5 in | Wt 170.0 lb

## 2019-08-24 DIAGNOSIS — K5730 Diverticulosis of large intestine without perforation or abscess without bleeding: Secondary | ICD-10-CM | POA: Diagnosis not present

## 2019-08-24 DIAGNOSIS — R110 Nausea: Secondary | ICD-10-CM

## 2019-08-24 DIAGNOSIS — K5289 Other specified noninfective gastroenteritis and colitis: Secondary | ICD-10-CM | POA: Diagnosis present

## 2019-08-24 DIAGNOSIS — D124 Benign neoplasm of descending colon: Secondary | ICD-10-CM

## 2019-08-24 DIAGNOSIS — R197 Diarrhea, unspecified: Secondary | ICD-10-CM

## 2019-08-24 DIAGNOSIS — R1084 Generalized abdominal pain: Secondary | ICD-10-CM

## 2019-08-24 DIAGNOSIS — R634 Abnormal weight loss: Secondary | ICD-10-CM

## 2019-08-24 DIAGNOSIS — K2950 Unspecified chronic gastritis without bleeding: Secondary | ICD-10-CM | POA: Diagnosis not present

## 2019-08-24 MED ORDER — SODIUM CHLORIDE 0.9 % IV SOLN: 500.0000 mL | Freq: Once | INTRAVENOUS | Status: DC

## 2019-08-24 NOTE — Patient Instructions (Signed)
Information on polyps and diverticulosis given to you today.  Await pathology results.  YOU HAD AN ENDOSCOPIC PROCEDURE TODAY AT [facility]:   Refer to the procedure report that was given to you for any specific questions about what was found during the examination.  If the procedure report does not answer your questions, please call your gastroenterologist to clarify.  If you requested that your care partner not be given the details of your procedure findings, then the procedure report has been included in a sealed envelope for you to review at your convenience later.  YOU SHOULD EXPECT: Some feelings of bloating in the abdomen. Passage of more gas than usual.  Walking can help get rid of the air that was put into your GI tract during the procedure and reduce the bloating. If you had a lower endoscopy (such as a colonoscopy or flexible sigmoidoscopy) you may notice spotting of blood in your stool or on the toilet paper. If you underwent a bowel prep for your procedure, you may not have a normal bowel movement for a few days.  Please Note:  You might notice some irritation and congestion in your nose or some drainage.  This is from the oxygen used during your procedure.  There is no need for concern and it should clear up in a day or so.  SYMPTOMS TO REPORT IMMEDIATELY:   Following lower endoscopy (colonoscopy or flexible sigmoidoscopy):  Excessive amounts of blood in the stool  Significant tenderness or worsening of abdominal pains  Swelling of the abdomen that is new, acute  Fever of 100F or higher   Following upper endoscopy (EGD)  Vomiting of blood or coffee ground material  New chest pain or pain under the shoulder blades  Painful or persistently difficult swallowing  New shortness of breath  Fever of 100F or higher  Black, tarry-looking stools  For urgent or emergent issues, a gastroenterologist can be reached at any hour by calling [phone]. Do not use  MyChart messaging for urgent concerns.    DIET:  We do recommend a small meal at first, but then you may proceed to your regular diet.  Drink plenty of fluids but you should avoid alcoholic beverages for 24 hours.  ACTIVITY:  You should plan to take it easy for the rest of today and you should NOT DRIVE or use heavy machinery until tomorrow (because of the sedation medicines used during the test).    FOLLOW UP: Our staff will call the number listed on your records 48-72 hours following your procedure to check on you and address any questions or concerns that you may have regarding the information given to you following your procedure. If we do not reach you, we will leave a message.  We will attempt to reach you two times.  During this call, we will ask if you have developed any symptoms of COVID 19. If you develop any symptoms (ie: fever, flu-like symptoms, shortness of breath, cough etc.) before then, please call [phone].  If you test positive for Covid 19 in the 2 weeks post procedure, please call and report this information to [place].    If any biopsies were taken you will be contacted by phone or by letter within the next 1-3 weeks.  Please call us at [phone] if you have not heard about the biopsies in 3 weeks.    SIGNATURES/CONFIDENTIALITY: You and/or your care partner have signed paperwork which will be entered into your electronic medical  record.  These signatures attest to the fact that that the information above on your After Visit Summary has been reviewed and is understood.  Full responsibility of the confidentiality of this discharge information lies with you and/or your care-partner. 

## 2019-08-24 NOTE — Progress Notes (Signed)
Called to room to assist during endoscopic procedure.  Patient ID and intended procedure confirmed with present staff. Received instructions for my participation in the procedure from the performing physician.  

## 2019-08-24 NOTE — Op Note (Signed)
[facility] Patient Name: Nicole [name] Procedure Date: 08/24/2019 2:15 PM MRN: [id number] Endoscopist: Mallie Mussel L. Loletha Carrow , MD Age: 39 Referring MD:  Date of Birth: 11/21/1980 Gender: Female Account #: [id number] Procedure:                Colonoscopy Indications:              Generalized abdominal pain, Chronic diarrhea,                            Weight loss (see EGD report for add'l history) Medicines:                Monitored Anesthesia Care Procedure:                Pre-Anesthesia Assessment:                           - Prior to the procedure, a History and Physical                            was performed, and patient medications and                            allergies were reviewed. The patient's tolerance of                            previous anesthesia was also reviewed. The risks                            and benefits of the procedure and the sedation                            options and risks were discussed with the patient.                            All questions were answered, and informed consent                            was obtained. Prior Anticoagulants: The patient has                            taken no previous anticoagulant or antiplatelet                            agents. ASA Grade Assessment: II - A patient with                            mild systemic disease. After reviewing the risks                            and benefits, the patient was deemed in                            satisfactory condition to undergo the procedure.  After obtaining informed consent, the colonoscope                            was passed under direct vision. Throughout the                            procedure, the patient's blood pressure, pulse, and                            oxygen saturations were monitored continuously. The                            Colonoscope was introduced through the anus and                            advanced  to the the cecum, identified by                            appendiceal orifice and ileocecal valve. The                            colonoscopy was performed without difficulty. The                            patient tolerated the procedure well. The quality                            of the bowel preparation was excellent. The                            terminal ileum, ileocecal valve, appendiceal                            orifice, and rectum were photographed. Scope In: 2:35:40 PM Scope Out: 2:50:07 PM Scope Withdrawal Time: 0 hours 12 minutes 2 seconds  Total Procedure Duration: 0 hours 14 minutes 27 seconds  Findings:                 The perianal and digital rectal examinations were                            normal.                           Normal mucosa was found in the entire colon.                            Biopsies for histology were taken with a cold                            forceps from the right colon and left colon for                            evaluation of microscopic colitis.  A 5 mm polyp was found in the descending colon. The                            polyp was sessile. The polyp was removed with a                            cold snare. Resection and retrieval were complete.                           A few diverticula were found in the sigmoid colon.                           The exam was otherwise without abnormality on                            direct and retroflexion views. Impression:               - Normal mucosa in the entire examined colon.                            Biopsied.                           - One 5 mm polyp in the descending colon, removed                            with a cold snare. Resected and retrieved.                           - Diverticulosis in the sigmoid colon.                           - The examination was otherwise normal on direct                            and retroflexion views. Recommendation:            - Patient has a contact number available for                            emergencies. The signs and symptoms of potential                            delayed complications were discussed with the                            patient. Return to normal activities tomorrow.                            Written discharge instructions were provided to the                            patient.                           -  Resume previous diet.                           - Continue present medications.                           - Await pathology results.                           - Repeat colonoscopy is recommended for                            surveillance. The colonoscopy date will be                            determined after pathology results from today's                            exam become available for review.                           - See the other procedure note for documentation of                            additional recommendations. Bresha Hosack L. Loletha Carrow, MD 08/24/2019 2:59:32 PM This report has been signed electronically.

## 2019-08-24 NOTE — Op Note (Signed)
[facility] Patient Name: Nicole [name] Procedure Date: 08/24/2019 2:15 PM MRN: [id number] Endoscopist: Mallie Mussel L. Loletha Carrow , MD Age: 39 Referring MD:  Date of Birth: 14-Feb-1981 Gender: Female Account #: [id number] Procedure:                Upper GI endoscopy Indications:              Generalized abdominal pain, Nausea, Weight loss                            (documented 12 pounds in last 6 months - recent                            normal CBC, CMP, TSH, tTG and IgA) Medicines:                Monitored Anesthesia Care Procedure:                Pre-Anesthesia Assessment:                           - Prior to the procedure, a History and Physical                            was performed, and patient medications and                            allergies were reviewed. The patient's tolerance of                            previous anesthesia was also reviewed. The risks                            and benefits of the procedure and the sedation                            options and risks were discussed with the patient.                            All questions were answered, and informed consent                            was obtained. Prior Anticoagulants: The patient has                            taken no previous anticoagulant or antiplatelet                            agents. ASA Grade Assessment: II - A patient with                            mild systemic disease. After reviewing the risks                            and benefits, the patient was deemed in  satisfactory condition to undergo the procedure.                           After obtaining informed consent, the endoscope was                            passed under direct vision. Throughout the                            procedure, the patient's blood pressure, pulse, and                            oxygen saturations were monitored continuously. The                            Endoscope was  introduced through the mouth, and                            advanced to the second part of duodenum. The upper                            GI endoscopy was accomplished without difficulty.                            The patient tolerated the procedure well. Scope In: Scope Out: Findings:                 The esophagus was normal.                           The entire examined stomach was normal. Biopsies                            were taken with a cold forceps for histology.                            ([place] protocol).                           The cardia and gastric fundus were normal on                            retroflexion.                           The examined duodenum was normal. Biopsies for                            histology were taken with a cold forceps for                            evaluation of celiac disease. Complications:            No immediate complications. Estimated Blood Loss:     Estimated blood loss was minimal. Impression:               -  Normal esophagus.                           - Normal stomach. Biopsied.                           - Normal examined duodenum. Biopsied. Recommendation:           - Patient has a contact number available for                            emergencies. The signs and symptoms of potential                            delayed complications were discussed with the                            patient. Return to normal activities tomorrow.                            Written discharge instructions were provided to the                            patient.                           - Resume previous diet.                           - Continue present medications.                           - Await pathology results.                           - See the other procedure note for documentation of                            additional recommendations. Treazure Nery L. Loletha Carrow, MD 08/24/2019 2:55:56 PM This report has been signed electronically.

## 2019-08-24 NOTE — Progress Notes (Signed)
To PACU, VSS. Report to [email or web address] 

## 2019-08-24 NOTE — Progress Notes (Signed)
[place]  Per pt her paternal grandmother had cancer, but not sure where it was. Pt said her cancer spread quickly and again, not sure where the cancer was. maw

## 2019-08-26 ENCOUNTER — Telehealth: Payer: Self-pay | Admitting: *Deleted

## 2019-08-26 NOTE — Telephone Encounter (Signed)
  Follow up Call-  Call back number 08/24/2019  Post procedure Call Back phone  # [phone] cell  Permission to leave phone message Yes  Some recent data might be hidden     Patient questions:  Do you have a fever, pain , or abdominal swelling? No. Pain Score  0 *  Have you tolerated food without any problems? Yes.    Have you been able to return to your normal activities? Yes.    Do you have any questions about your discharge instructions: Diet   No. Medications  No. Follow up visit  No.  Do you have questions or concerns about your Care? No.  Actions: * If pain score is 4 or above: No action needed, pain <4.  1. Have you developed a fever since your procedure? no  2.   Have you had an respiratory symptoms (SOB or cough) since your procedure? no  3.   Have you tested positive for COVID 19 since your procedure no  4.   Have you had any family members/close contacts diagnosed with the COVID 19 since your procedure?  no   If yes to any of these questions please route to Joylene John, RN and Erenest Rasher, RN'

## 2019-08-27 ENCOUNTER — Encounter: Payer: Self-pay | Admitting: Gastroenterology

## 2019-09-02 ENCOUNTER — Other Ambulatory Visit: Payer: Self-pay

## 2019-09-02 DIAGNOSIS — R1084 Generalized abdominal pain: Secondary | ICD-10-CM

## 2019-09-02 DIAGNOSIS — R634 Abnormal weight loss: Secondary | ICD-10-CM

## 2019-09-03 ENCOUNTER — Ambulatory Visit: Payer: No Typology Code available for payment source | Admitting: Allergy

## 2019-09-06 ENCOUNTER — Other Ambulatory Visit: Payer: Self-pay

## 2019-09-06 ENCOUNTER — Ambulatory Visit (INDEPENDENT_AMBULATORY_CARE_PROVIDER_SITE_OTHER)
Admission: RE | Admit: 2019-09-06 | Discharge: 2019-09-06 | Disposition: A | Discharge status: Home / Self Care | Payer: PRIVATE HEALTH INSURANCE | Source: Ambulatory Visit | Attending: Gastroenterology | Admitting: Gastroenterology

## 2019-09-06 DIAGNOSIS — R634 Abnormal weight loss: Secondary | ICD-10-CM

## 2019-09-06 DIAGNOSIS — R1084 Generalized abdominal pain: Secondary | ICD-10-CM

## 2019-09-06 MED ORDER — IOHEXOL 300 MG/ML  SOLN
100.0000 mL | Freq: Once | INTRAMUSCULAR | Status: AC | PRN
  Administered 2019-09-06: 100 mL via INTRAVENOUS

## 2019-09-07 ENCOUNTER — Ambulatory Visit: Payer: No Typology Code available for payment source | Admitting: Family Medicine

## 2019-09-13 ENCOUNTER — Encounter: Payer: Self-pay | Admitting: Family Medicine

## 2019-09-23 ENCOUNTER — Ambulatory Visit: Payer: 59 | Admitting: Gastroenterology

## 2019-12-24 ENCOUNTER — Ambulatory Visit (INDEPENDENT_AMBULATORY_CARE_PROVIDER_SITE_OTHER): Payer: No Typology Code available for payment source | Admitting: Family Medicine

## 2019-12-24 ENCOUNTER — Other Ambulatory Visit: Payer: Self-pay

## 2019-12-24 ENCOUNTER — Encounter: Payer: Self-pay | Admitting: Family Medicine

## 2019-12-24 VITALS — BP 113/75 | HR 86 | Temp 98.4°F | Ht 66.0 in | Wt 164.6 lb

## 2019-12-24 DIAGNOSIS — E049 Nontoxic goiter, unspecified: Secondary | ICD-10-CM | POA: Diagnosis not present

## 2019-12-24 DIAGNOSIS — M359 Systemic involvement of connective tissue, unspecified: Secondary | ICD-10-CM

## 2019-12-24 NOTE — Progress Notes (Signed)
8/13/20214:06 PM  Nicole [name] Dec 16, 1980, 39 y.o., female [id number]  Chief Complaint  Patient presents with  . joint pain all over    this time going on 2-3 weeks   . Fatigue    HPI:   Patient is a 39 y.o. female with past medical history significant for connective tissue disorder who presents today for joint pain  Has been seeing Marella Chimes PA-C, [place] [facility] at [facility] She was started on plaquenil about 4 months ago Dx remains unspecified connective tissue disorder, possible sjorgens Last flare up was about 6 months ago Having another flare up - having joint pain, fatigue, headache, burning scalp with hair loss x 2-3 weeks She is requesting pred   Wt Readings from Last 3 Encounters:  12/24/19 164 lb 9.6 oz (74.7 kg)  08/24/19 170 lb (77.1 kg)  08/04/19 170 lb (77.1 kg)     Depression screen [facility] 2/9 12/24/2019 07/26/2019 04/12/2019  Decreased Interest 0 0 0  Down, Depressed, Hopeless 0 0 0  PHQ - 2 Score 0 0 0  Altered sleeping - - -  Tired, decreased energy - - -  Change in appetite - - -  Feeling bad or failure about yourself  - - -  Trouble concentrating - - -  Moving slowly or fidgety/restless - - -  Suicidal thoughts - - -  PHQ-9 Score - - -    Fall Risk  12/24/2019 07/26/2019 04/12/2019 10/29/2018 09/02/2018  Falls in the past year? 0 0 0 0 0  Number falls in past yr: 0 0 0 0 -  Injury with Fall? 0 0 0 0 -  Follow up Falls evaluation completed Falls evaluation completed - - -     Allergies  Allergen Reactions  . Latex Hives  . Mangifera Indica Hives  . Mango Flavor   . Strawberry Extract Hives    Prior to Admission medications   Medication Sig Start Date End Date Taking? Authorizing Provider  hydroxychloroquine (PLAQUENIL) 200 MG tablet Take 200 mg by mouth 2 (two) times daily. 08/20/19  Yes [provider]    Past Medical History:  Diagnosis Date  . Allergy   . Connective tissue disorder ([place]) 2017  . Depression   .  Eczema   . Heart murmur    as a child  . Hyperlipidemia    during 2 nd pregnancy  . Scarlet fever    as a child    Past Surgical History:  Procedure Laterality Date  . CHOLECYSTECTOMY  2013  . SHOULDER SURGERY  2019   bone spur  . WISDOM TOOTH EXTRACTION  2013, 2016    Social History   Tobacco Use  . Smoking status: Current Some Day Smoker    Packs/day: 0.25    Years: 10.00    Pack years: 2.50    Types: Cigarettes  . Smokeless tobacco: Never Used  Substance Use Topics  . Alcohol use: Yes    Alcohol/week: 0.0 standard drinks    Comment: occasional     Family History  Problem Relation Age of Onset  . Lupus Mother   . High Cholesterol Mother   . Diabetes Father   . Healthy Daughter   . Healthy Son   . Colon cancer Neg Hx   . Esophageal cancer Neg Hx   . Rectal cancer Neg Hx   . Stomach cancer Neg Hx     ROS Per hpi  OBJECTIVE:  Today's Vitals   12/24/19 1544  BP: 113/75  Pulse: 86  Temp: 98.4 F (36.9 C)  TempSrc: Temporal  SpO2: 95%  Weight: 164 lb 9.6 oz (74.7 kg)  Height: 5\' 6"  (1.676 m)   Body mass index is 26.57 kg/m.   Physical Exam Vitals and nursing note reviewed.  Constitutional:      Appearance: She is well-developed.  HENT:     Head: Normocephalic and atraumatic.     Mouth/Throat:     Pharynx: No oropharyngeal exudate.  Eyes:     General: No scleral icterus.    Extraocular Movements: Extraocular movements intact.     Conjunctiva/sclera: Conjunctivae normal.     Pupils: Pupils are equal, round, and reactive to light.  Neck:     Thyroid: Thyromegaly present. No thyroid mass or thyroid tenderness.  Cardiovascular:     Rate and Rhythm: Normal rate and regular rhythm.     Heart sounds: Normal heart sounds. No murmur heard.  No friction rub. No gallop.   Pulmonary:     Effort: Pulmonary effort is normal.     Breath sounds: Normal breath sounds. No wheezing, rhonchi or rales.  Musculoskeletal:     Cervical back: Neck supple.      Comments: FROM, no synovitis noted other than in ankles  Skin:    General: Skin is warm and dry.     Comments: Scalp with thinning hair along frontal hair line and crown, no scarring, lesions or rashes noted  Neurological:     Mental Status: She is alert and oriented to person, place, and time.     No results found for this or any previous visit (from the past 24 hour(s)).  No results found.   ASSESSMENT and PLAN  1. Connective tissue disorder ([place]) Checking inflammatory markers, advised reach out to rheum - C-reactive protein - Sedimentation Rate  2. Goiter - TSH - T4, Free - US THYROID; Future  No follow-ups on file.    Rutherford Guys, MD [facility] [place], [place] [address] Ph.  [phone] Fax [phone]

## 2019-12-24 NOTE — Patient Instructions (Signed)
° ° ° °  If you have lab work done today you will be contacted with your lab results within the next 2 weeks.  If you have not heard from us then please contact us. The fastest way to get your results is to register for My Chart. ° ° °IF you received an x-ray today, you will receive an invoice from [facility]. Please contact [facility] at [phone] with questions or concerns regarding your invoice.  ° °IF you received labwork today, you will receive an invoice from [facility]. Please contact [facility] at [phone] with questions or concerns regarding your invoice.  ° °Our billing staff will not be able to assist you with questions regarding bills from these companies. ° °You will be contacted with the lab results as soon as they are available. The fastest way to get your results is to activate your My Chart account. Instructions are located on the last page of this paperwork. If you have not heard from us regarding the results in 2 weeks, please contact this office. °  ° ° ° °

## 2019-12-25 LAB — T4, FREE: Free T4: 1.05 ng/dL (ref 0.82–1.77)

## 2019-12-25 LAB — TSH: TSH: 1.55 u[IU]/mL (ref 0.450–4.500)

## 2019-12-25 LAB — SEDIMENTATION RATE: Sed Rate: 8 mm/hr (ref 0–32)

## 2019-12-25 LAB — C-REACTIVE PROTEIN: CRP: 2 mg/L (ref 0–10)

## 2020-01-04 ENCOUNTER — Ambulatory Visit
Admission: RE | Admit: 2020-01-04 | Discharge: 2020-01-04 | Disposition: A | Discharge status: Home / Self Care | Payer: No Typology Code available for payment source | Source: Ambulatory Visit | Attending: Family Medicine | Admitting: Family Medicine

## 2020-01-04 DIAGNOSIS — E049 Nontoxic goiter, unspecified: Secondary | ICD-10-CM

## 2020-03-03 ENCOUNTER — Other Ambulatory Visit: Payer: Self-pay

## 2020-03-03 ENCOUNTER — Ambulatory Visit (INDEPENDENT_AMBULATORY_CARE_PROVIDER_SITE_OTHER): Payer: No Typology Code available for payment source | Admitting: Family Medicine

## 2020-03-03 ENCOUNTER — Encounter: Payer: Self-pay | Admitting: Family Medicine

## 2020-03-03 VITALS — BP 113/79 | HR 89 | Temp 98.4°F | Ht 66.0 in | Wt 161.4 lb

## 2020-03-03 DIAGNOSIS — S39012A Strain of muscle, fascia and tendon of lower back, initial encounter: Secondary | ICD-10-CM

## 2020-03-03 MED ORDER — METHOCARBAMOL 500 MG PO TABS: 500.0000 mg | ORAL_TABLET | Freq: Three times a day (TID) | ORAL | 1 refills | Status: DC

## 2020-03-03 MED ORDER — DICLOFENAC SODIUM 75 MG PO TBEC: 75.0000 mg | DELAYED_RELEASE_TABLET | Freq: Two times a day (BID) | ORAL | Status: DC

## 2020-03-03 NOTE — Patient Instructions (Addendum)
  Take diclofenac 75 mg 1 twice daily for pain and inflammation  In addition to this you can take Tylenol (acetaminophen) 500 mg maximum of 2 pills 3 times daily.  Do not exceed 6 pills (3000 mg) in 24 hours  Take the muscle relaxant Robaxin (methocarbamol) 500 mg 1 3 times daily as needed for muscle relaxation.    Referral is being made to physical therapy.  Return if worse   if you have lab work done today you will be contacted with your lab results within the next 2 weeks.  If you have not heard from [place] then please contact us. The fastest way to get your results is to register for My Chart.   IF you received an x-ray today, you will receive an invoice from [facility]. Please contact [facility] at [phone] with questions or concerns regarding your invoice.   IF you received labwork today, you will receive an invoice from [facility]. Please contact [facility] at [phone] with questions or concerns regarding your invoice.   Our billing staff will not be able to assist you with questions regarding bills from these companies.  You will be contacted with the lab results as soon as they are available. The fastest way to get your results is to activate your My Chart account. Instructions are located on the last page of this paperwork. If you have not heard from [place] regarding the results in 2 weeks, please contact this office.
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
## 2020-06-08 ENCOUNTER — Encounter: Payer: No Typology Code available for payment source | Admitting: Emergency Medicine

## 2020-06-09 ENCOUNTER — Encounter: Payer: Self-pay | Admitting: Emergency Medicine

## 2020-07-26 IMAGING — CT CT ABD-PELV W/ CM
2 of 4 series · 15 of 46 positions shown, 17 images · IV contrast (omnipaque)
Comparison: 03/22/2010

CLINICAL DATA: Upper abdominal pain. Diarrhea and loss of weight.

EXAM:
CT ABDOMEN AND PELVIS WITH CONTRAST
TECHNIQUE: Multidetector CT imaging of the abdomen and pelvis was performed
using the standard protocol following bolus administration of
intravenous contrast.
CONTRAST:  100mL OMNIPAQUE IOHEXOL 300 MG/ML  SOLN

[Series 2: abd/pel w · axial · 0.69mm/px · z∈[+914,+1364]mm · 12 of 100 slices shown, 14 images]
[im 5/100  soft-tissue]
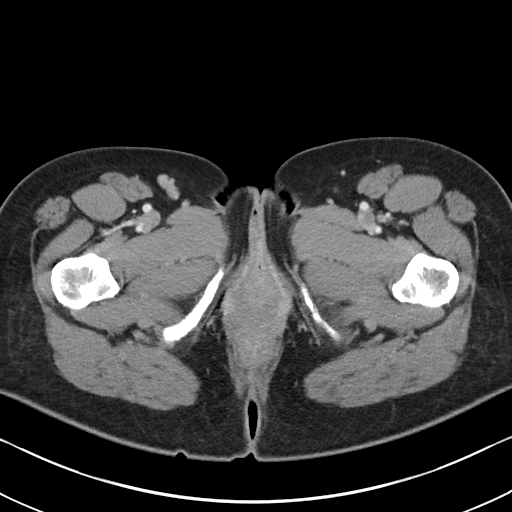
[im 5/100  bone]
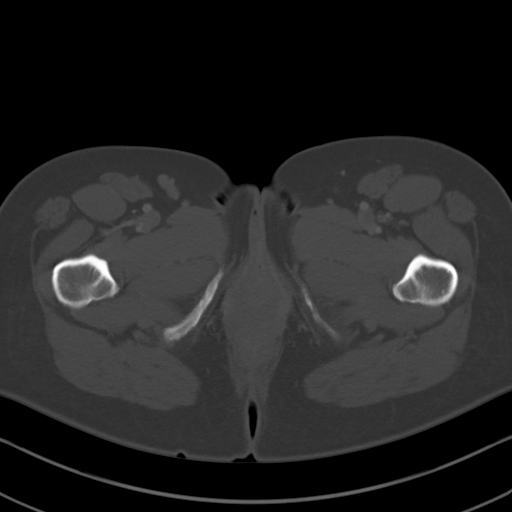
[im 13/100  soft-tissue]
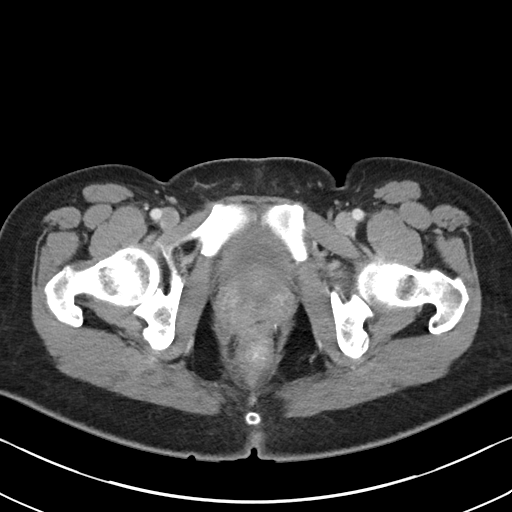
[im 21/100  soft-tissue]
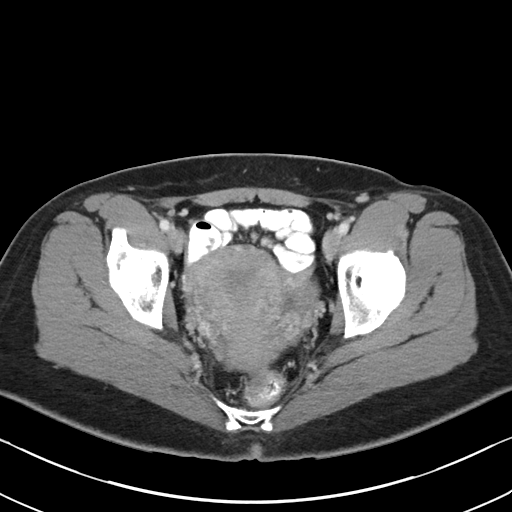
[im 29/100  soft-tissue]
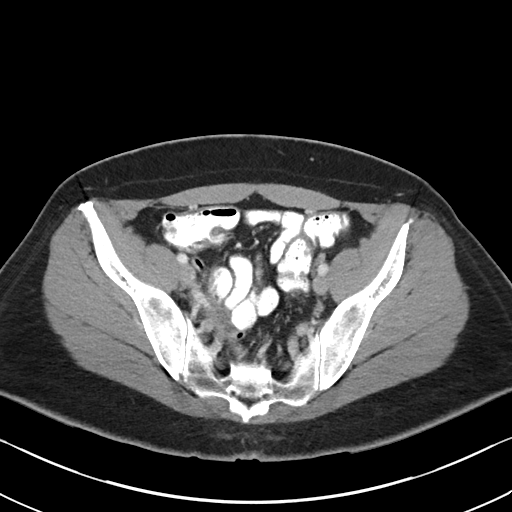
[im 38/100  soft-tissue]
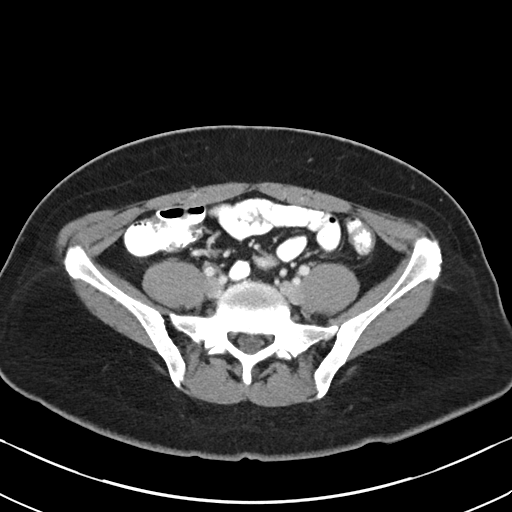
[im 46/100  soft-tissue]
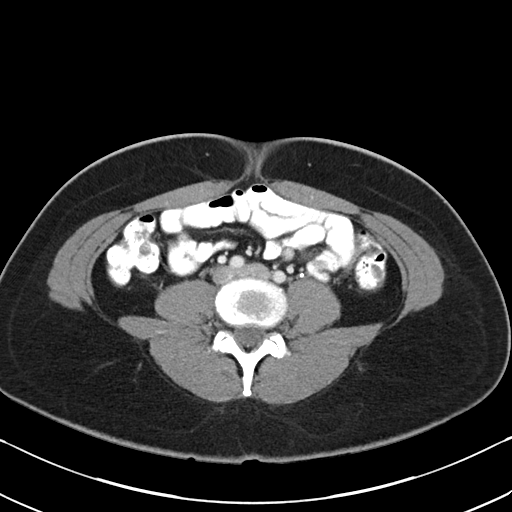
[im 54/100  soft-tissue]
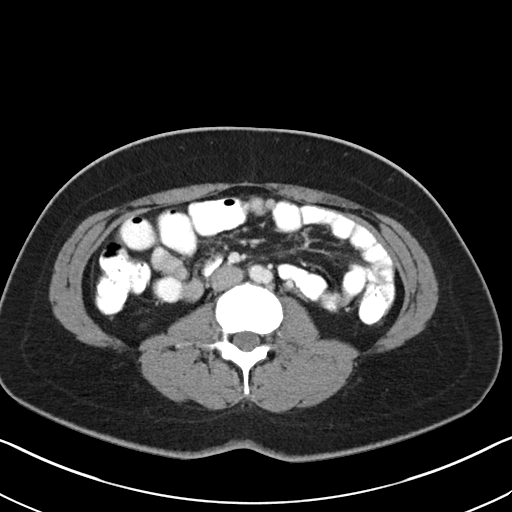
[im 62/100  soft-tissue]
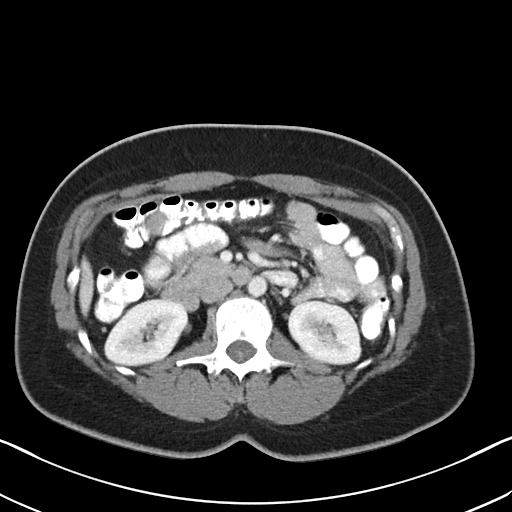
[im 71/100  soft-tissue]
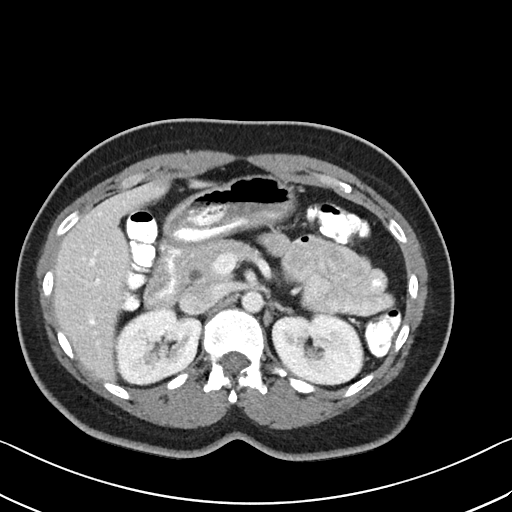
[im 71/100  bone]
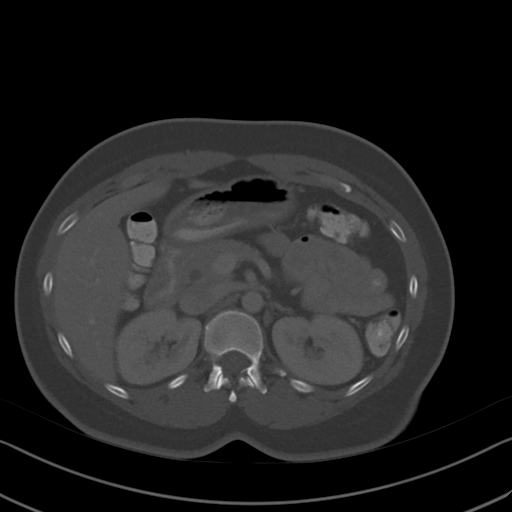
[im 79/100  soft-tissue]
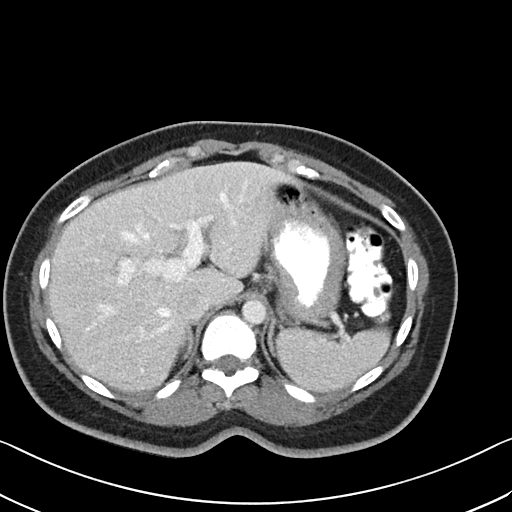
[im 87/100  soft-tissue]
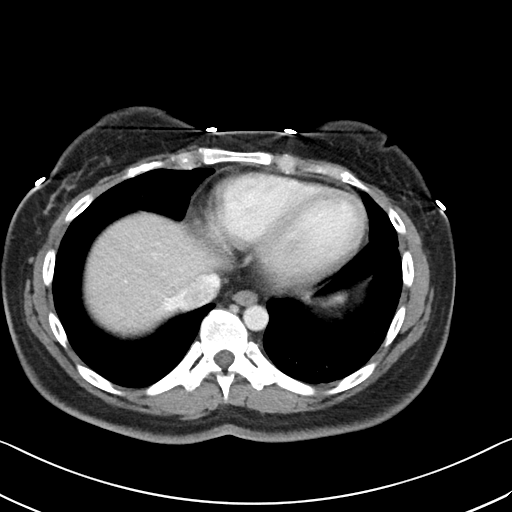
[im 95/100  soft-tissue]
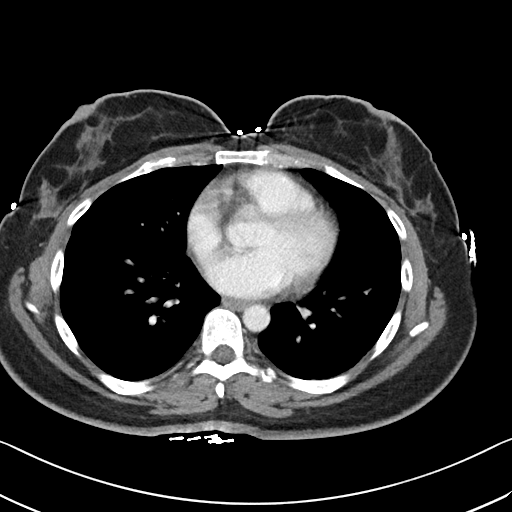

[Series 5: abd/pel w st · coronal · 0.60mm/px · 3 of 78 slices shown]
[im 26/78  soft-tissue]
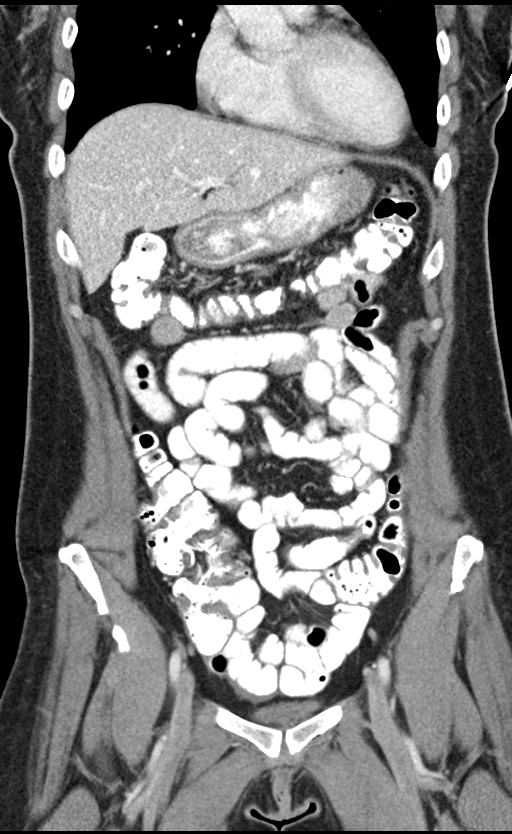
[im 35/78  soft-tissue]
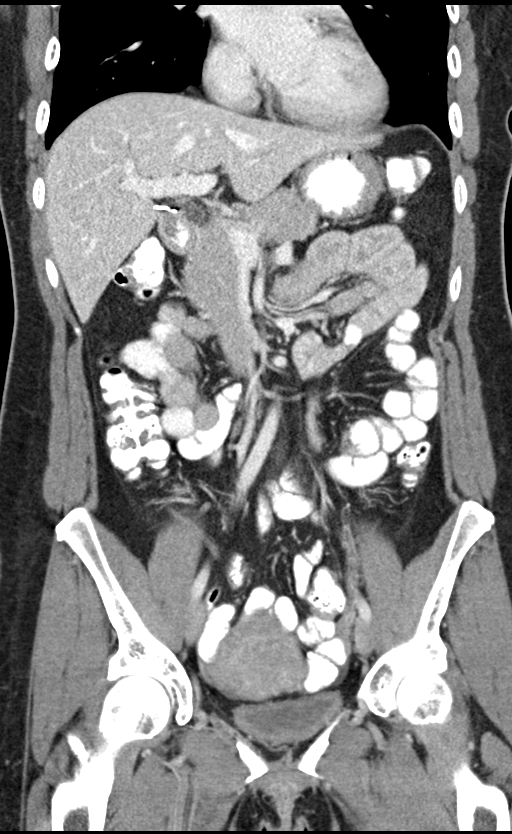
[im 43/78  soft-tissue]
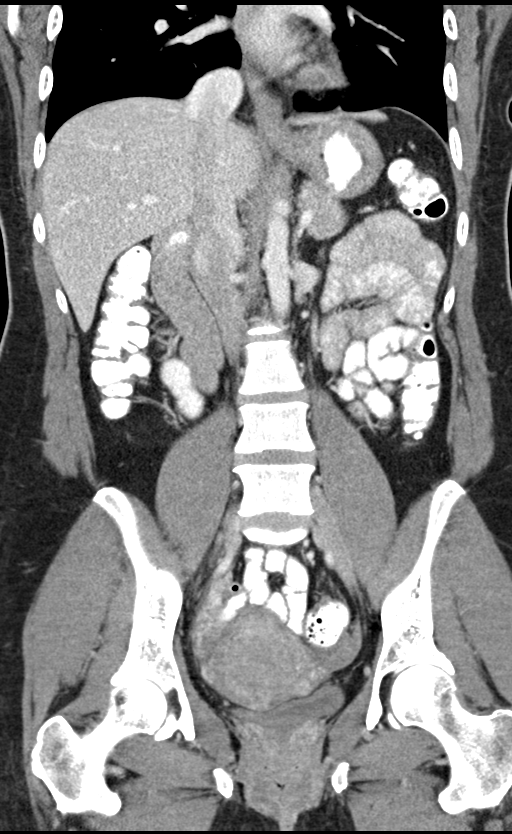

[15 of 46 positions shown; findings below may reference images not displayed]

FINDINGS: Lower chest: No acute abnormality.

Hepatobiliary: No focal liver abnormality is seen. Status post
cholecystectomy. No biliary dilatation.

Pancreas: Unremarkable. No pancreatic ductal dilatation or
surrounding inflammatory changes.

Spleen: Normal in size without focal abnormality.

Adrenals/Urinary Tract: Normal appearance of the adrenal glands.

The kidneys are unremarkable. No mass or hydronephrosis identified.
The urinary bladder is unremarkable.

Stomach/Bowel: Stomach appears normal. The small bowel loops have a
normal course and caliber without obstruction. The appendix is
visualized and appears normal.A few scattered colonic diverticula
noted without acute inflammation.

Vascular/Lymphatic: No significant vascular findings are present. No
enlarged abdominal or pelvic lymph nodes.

Reproductive: Posterior fundal fibroid measures 2.5 x 2.0 cm, image
53/6. Corpus luteum identified within the right ovary. No adnexal
mass.

Other: No free fluid or fluid collections.

Musculoskeletal: No acute or significant osseous findings.
IMPRESSION: 1. No acute findings within the abdomen or pelvis. No explanation
for patient's upper abdominal pain and weight loss.
2. Posterior fundal fibroid.

## 2020-08-21 ENCOUNTER — Telehealth (HOSPITAL_COMMUNITY): Payer: Self-pay | Admitting: Licensed Clinical Social Worker

## 2020-08-30 ENCOUNTER — Other Ambulatory Visit: Payer: Self-pay

## 2020-08-30 ENCOUNTER — Other Ambulatory Visit (HOSPITAL_COMMUNITY)
Payer: No Typology Code available for payment source | Attending: Psychiatry | Admitting: Licensed Clinical Social Worker

## 2020-08-30 DIAGNOSIS — R41844 Frontal lobe and executive function deficit: Secondary | ICD-10-CM | POA: Insufficient documentation

## 2020-08-30 DIAGNOSIS — F319 Bipolar disorder, unspecified: Secondary | ICD-10-CM | POA: Insufficient documentation

## 2020-08-30 DIAGNOSIS — R4589 Other symptoms and signs involving emotional state: Secondary | ICD-10-CM | POA: Insufficient documentation

## 2020-09-04 ENCOUNTER — Other Ambulatory Visit (HOSPITAL_COMMUNITY): Payer: No Typology Code available for payment source | Admitting: Occupational Therapy

## 2020-09-04 ENCOUNTER — Other Ambulatory Visit: Payer: Self-pay

## 2020-09-04 ENCOUNTER — Encounter (HOSPITAL_COMMUNITY): Payer: Self-pay

## 2020-09-04 ENCOUNTER — Other Ambulatory Visit (HOSPITAL_COMMUNITY): Payer: No Typology Code available for payment source | Admitting: Licensed Clinical Social Worker

## 2020-09-04 DIAGNOSIS — F332 Major depressive disorder, recurrent severe without psychotic features: Secondary | ICD-10-CM

## 2020-09-04 DIAGNOSIS — R4589 Other symptoms and signs involving emotional state: Secondary | ICD-10-CM

## 2020-09-04 DIAGNOSIS — R41844 Frontal lobe and executive function deficit: Secondary | ICD-10-CM

## 2020-09-04 DIAGNOSIS — F319 Bipolar disorder, unspecified: Secondary | ICD-10-CM

## 2020-09-04 NOTE — Therapy (Signed)
[employer] [place], [place], [address] Phone: [phone]   Fax:  [phone] Virtual Visit via Video Note  I connected with Nicole [name] on 09/04/20 at  11:00 AM EDT by a video enabled telemedicine application and verified that I am speaking with the correct person using two identifiers.  Location: Patient: Patient Home Provider: Clinic Office   I discussed the limitations of evaluation and management by telemedicine and the availability of in person appointments. The patient expressed understanding and agreed to proceed.   I discussed the assessment and treatment plan with the patient. The patient was provided an opportunity to ask questions and all were answered. The patient agreed with the plan and demonstrated an understanding of the instructions.   The patient was advised to call back or seek an in-person evaluation if the symptoms worsen or if the condition fails to improve as anticipated.  I provided 73 minutes of non-face-to-face time during this encounter. 60 minutes OT Group Therapy 13 minutes OT Evaluation  Ponciano Ort, OT   Occupational Therapy Evaluation  Patient Details  Name: Nicole [name] MRN: [id number] Date of Birth: 11/30/1980 Referring Provider (OT): Ricky Ala   Encounter Date: 09/04/2020   OT End of Session - 09/04/20 1218    Visit Number 1    Number of Visits 20    Date for OT Re-Evaluation 10/02/20    OT Start Time 1100   OT Eval [id number]   OT Stop Time 1200    OT Time Calculation (min) 60 min    Activity Tolerance Patient tolerated treatment well    Behavior During Therapy Anxious;Flat affect           Past Medical History:  Diagnosis Date  . Allergy   . Connective tissue disorder ([place]) 2017  . Depression   . Eczema   . Heart murmur    as a child  . Hyperlipidemia    during 2 nd pregnancy  . Scarlet fever    as a child    Past  Surgical History:  Procedure Laterality Date  . CHOLECYSTECTOMY  2013  . SHOULDER SURGERY  2019   bone spur  . WISDOM TOOTH EXTRACTION  2013, 2016    There were no vitals filed for this visit.   Subjective Assessment - 09/04/20 1217    Currently in Pain? No/denies    Multiple Pain Sites No             [facility] OT Assessment - 09/04/20 0001      Assessment   Medical Diagnosis Bipolar I Disorder    Referring Provider (OT) Ricky Ala      Precautions   Precautions None      Balance Screen   Has the patient fallen in the past 6 months No    Has the patient had a decrease in activity level because of a fear of falling?  No    Is the patient reluctant to leave their home because of a fear of falling?  No           OT Education - 09/04/20 1217    Education Details Educated on OT within [facility] programming in addition to different factors that contribute to our ability to manage our time, along with specific time management tips/strategies, including procrastination    Person(s) Educated Patient    Methods Explanation;Handout    Comprehension Verbalized understanding  OT Short Term Goals - 09/04/20 1220      OT SHORT TERM GOAL #1   Title Pt will actively engage in OT group sessions throughout duration of PHP programming, in order to promote daily structure, social engagement, and opportunities to develop and utilize adaptive strategies to maximize functional performance in preparation for safe transition and integration back into school, work, and the community.    Time 4    Period Weeks    Status New    Target Date 10/02/20      OT SHORT TERM GOAL #2   Title Pt will demonstrate improved ability to communicate feelings/needs/wants as evidenced by, active participation in OT sessions, throughout duration of PHP programming, in order to safely transition back into the community at discharge.    Time 4    Period Weeks    Status New    Target Date 10/02/20       OT SHORT TERM GOAL #3   Title Pt will identify 1-3 stress management strategies she can utilize, in order to safely manage increased psychosocial stressors identified, with min cues, in preparation for safe transition back to the community at discharge.    Time 4    Period Weeks    Status New    Target Date 10/02/20      OT SHORT TERM GOAL #4   Title Pt will identify and implement 1-3 sleep hygiene strategies she can utilize, in order to improve sleep quality/ADL performance, in preparation for safe and healthy reintegration back into the community at discharge.    Time 4    Period Weeks    Status New    Target Date 10/02/20         Occupational Therapy Assessment 09/04/2020  Nicole [name] is a 40 y/o female with recent diagnosis of bipolar disorder and ADHD who was referred to the [facility] program from her outpatient psychiatrist with reports of several increased psychosocial stressors including being an empty nester for the first time, on-going conflict with her current husband of 7 years, stress at [employer] of being fired from her job at [employer], and one of her sons soon being deployed to [place] for the next three years, alongside exacerbation of her MH symptoms. Pt reports increased feelings of depression and anxiety as a result of these identified stressors and is open and willing to engage in programming. Pt reports a desire to engage in [place] programming in order to manage identified stressors and to engage meaningfully in identified areas of occupation including ADL/iADLs.  Upon approach, pt presents as guarded, anxious, though respondent to questions asked of her throughout assessment. Pt reports enjoying watching movies, going out with friends, and being with family and identifies goal for admission "get to back to what was normal for me".   Precautions/Limitations: None noted  Cognition: Appears intact   Visual Motor: No visual deficits noted/identified   Living Situation: Pt lives at home with her  husband of 7 years; pt currently has an open relationship and other partners are in/out of the picture. Pt also has adult children who live outside of the home.  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 Pt reports she is actively applying and looking for alternative positions.   ADL/iADL Performance: Pt reports decrease in her self-care/hygiene, rating it at a 4/10; reports difficulty finding motivation/energy, reports difficulty with showering,  changing clothes, brushing teeth, waking up on time, taking care of responsibilities around the house. Pt reports eating because she 'has too' though no longer cooks, reports difficulty maintaining a sleep cycle   Leisure Interests and Hobbies: Enjoys watching movies, going out with friends, and spending time with her family  Social Support: Seeks support from her husband and OP psychiatrist; denies other friend/familial support at this time   What do you do when you are very stressed, angry, upset, sad or anxious? Isolate from others, Use drugs/alcohol, Cry, Sleep and Act out   What helps when you are not feeling well? Lying down with a cold face cloth, Wrapping in a blanket, Additional/Extra medication, Taking a shower or bath, Having a warm or cool drink, Watching TV and Listening to music  What are some things that make it MORE difficult for you when you are already upset? Being touched, Not having choices/input, Bedroom door being open, Not being able to express my opinion, Loud noises, Being criticized, Yelling and Particular time of year  Is there anything specific that you would like help with while you're in the partial hospitalization program? Coping Skills, Impulse Control, Relationships, Communication, Medication , Self-Care, Goal-setting and Sleep  What is your goal while you are here?  "Get back  to what was normal for me"  Assessment: Pt demonstrates behavior that inhibits/restricts participation in occupation and would benefit from skilled occupational therapy services to address current difficulties with symptom management, emotion regulation, socialization, stress management, time management, job readiness, financial wellness, health and nutrition, sleep hygiene, ADL/iADL performance and leisure participation, in preparation for reintegration and return to community at discharge.   Plan: Pt will participate in skilled occupational therapy sessions (group and/or individual) in order to promote daily structure, social engagement, and opportunities to develop and utilize adaptive strategies to maximize functional performance in preparation for safe transition and integration back into school, work, and/or the community at discharge. OT sessions will occur 4-5 x per week for 2-4 weeks.   Ponciano Ort, MOT, OTR/L  Group Session:   S: None noted*  O: Group began with a warm-up ice breaker activity where patients were encouraged to reflect on the scenario "If you had 86,400 dollars dropped into your bank account at midnight and 24 hours to spend it, what you use the money for? The only rule was that any money not used disappeared after 24 hours." Warm-up activity was used as an Designer, industrial/product and a way to connect that similar to the scenario of money, we do not get time back and there are 86,400 seconds in a day. Warm-up transitioned into today's group focused on topic of Time Management. Group members identified ways in which they currently struggle with managing their time and discussion focused on alternative strategies to recognize how we can be more productive and intentional with our time and managing it appropriately. Group members also discussed specific strategies to overcome procrastination.   A: Nicole [name] had difficulty engaging in today's group session, appearing in group with her  camera and microphone turned off for majority of discussion. Pt provided with moderate verbal cues to engage, however pt did not respond/contribute to discussion. Of note, pt appeared anxious and guarded during initial OT evaluation earlier in the morning and this was patient's first group session with this clinician. Pt did not directly identify any current issues/concerns with her time management skills, however did identify being late for work as something that was a stressor for her and a  result of her current mental health presentation. Will continue to encourage active engagement in future OT sessions.   P: Continue to attend PHP OT group sessions 5x week for 4 weeks to promote daily structure, social engagement, and opportunities to develop and utilize adaptive strategies to maximize functional performance in preparation for safe transition and integration back into school, work, and the community. Plan to address topic of procrastination in next OT group session.    Plan - 09/04/20 1219    Clinical Impression Statement Nicole [name] is a 40 y/o female with recent diagnosis of bipolar disorder and ADHD who was referred to the [facility] program from her outpatient psychiatrist with reports of several increased psychosocial stressors including being an empty nester for the first time, on-going conflict with her current husband of 7 years, stress at [employer] of being fired from her job at [employer], and one of her sons soon being deployed to [place] for the next three years, alongside exacerbation of her MH symptoms. Pt reports increased feelings of depression and anxiety as a result of these identified stressors and is open and willing to engage in programming. Pt reports a desire to engage in [place] programming in order to manage identified stressors and to engage meaningfully in identified areas of occupation including ADL/iADLs.    OT Occupational Profile and History Problem Focused Assessment - Including review of  records relating to presenting problem    Occupational performance deficits (Please refer to evaluation for details): ADL's;IADL's;Rest and Sleep;Education;Work;Leisure;Social Participation    Body Structure / Function / Physical Skills ADL    Cognitive Skills Attention;Emotional;Energy/Drive;Learn;Memory;Perception;Problem Solve;Safety Awareness;Temperament/Personality;Thought;Understand    Psychosocial Skills Coping Strategies;Environmental  Adaptations;Habits;Interpersonal Interaction;Routines and Behaviors    Rehab Potential Good    Clinical Decision Making Limited treatment options, no task modification necessary    Comorbidities Affecting Occupational Performance: May have comorbidities impacting occupational performance    Modification or Assistance to Complete Evaluation  No modification of tasks or assist necessary to complete eval    OT Frequency 5x / week    OT Duration 4 weeks    OT Treatment/Interventions Self-care/ADL training;Patient/family education;Coping strategies training;Psychosocial skills training    Consulted and Agree with Plan of Care Patient           Patient will benefit from skilled therapeutic intervention in order to improve the following deficits and impairments:   Body Structure / Function / Physical Skills: ADL Cognitive Skills: Attention,Emotional,Energy/Drive,Learn,Memory,Perception,Problem Solve,Safety Awareness,Temperament/Personality,Thought,Understand Psychosocial Skills: Coping Strategies,Environmental  Adaptations,Habits,Interpersonal Interaction,Routines and Behaviors   Visit Diagnosis: Difficulty coping  Frontal lobe and executive function deficit  Bipolar 1 disorder, depressed ([place])    Problem List Patient Active Problem List   Diagnosis Date Noted  . Loss of weight 08/04/2019  . Nausea without vomiting 08/04/2019  . Diarrhea 08/04/2019  . Encounter for other preprocedural examination 08/04/2019  . Family history of breast cancer  10/29/2018  . Post concussion syndrome 07/18/2014  . Visual disturbances 07/18/2014  . Memory changes 07/18/2014  . New onset headache 07/18/2014  . Fatigue 07/18/2014   09/04/2020  Ponciano Ort, MOT, OTR/L  09/04/2020, 12:22 PM  [place], [place], [address] Phone: [phone]   Fax:  [phone]  Name: Nicole [name] MRN: [id number] Date of Birth: 09-02-1980

## 2020-09-05 ENCOUNTER — Other Ambulatory Visit (HOSPITAL_COMMUNITY): Payer: No Typology Code available for payment source | Admitting: Occupational Therapy

## 2020-09-05 ENCOUNTER — Encounter (HOSPITAL_COMMUNITY): Payer: Self-pay

## 2020-09-05 ENCOUNTER — Other Ambulatory Visit (HOSPITAL_COMMUNITY): Payer: No Typology Code available for payment source | Admitting: Licensed Clinical Social Worker

## 2020-09-05 ENCOUNTER — Other Ambulatory Visit: Payer: Self-pay

## 2020-09-05 DIAGNOSIS — F332 Major depressive disorder, recurrent severe without psychotic features: Secondary | ICD-10-CM

## 2020-09-05 DIAGNOSIS — R4589 Other symptoms and signs involving emotional state: Secondary | ICD-10-CM | POA: Diagnosis not present

## 2020-09-05 DIAGNOSIS — F319 Bipolar disorder, unspecified: Secondary | ICD-10-CM

## 2020-09-05 DIAGNOSIS — R41844 Frontal lobe and executive function deficit: Secondary | ICD-10-CM

## 2020-09-05 MED ORDER — ARIPIPRAZOLE 5 MG PO TABS: 5.0000 mg | ORAL_TABLET | Freq: Every day | ORAL | 0 refills | Status: DC

## 2020-09-05 NOTE — Therapy (Signed)
[place], [place], [address] Phone: [phone]   Fax:  [phone] Virtual Visit via Video Note  I connected with Nicole [name] on 09/05/20 at  11:00 AM EDT by a video enabled telemedicine application and verified that I am speaking with the correct person using two identifiers.  Location: Patient: Patient Home Provider: Clinic Office   I discussed the limitations of evaluation and management by telemedicine and the availability of in person appointments. The patient expressed understanding and agreed to proceed.   I discussed the assessment and treatment plan with the patient. The patient was provided an opportunity to ask questions and all were answered. The patient agreed with the plan and demonstrated an understanding of the instructions.   The patient was advised to call back or seek an in-person evaluation if the symptoms worsen or if the condition fails to improve as anticipated.  I provided 60 minutes of non-face-to-face time during this encounter.   Ponciano Ort, OT   Occupational Therapy Treatment  Patient Details  Name: Nicole [name] MRN: [id number] Date of Birth: [birth year] Referring Provider (OT): Ricky Ala   Encounter Date: 09/05/2020   OT End of Session - 09/05/20 1543    Visit Number 2    Number of Visits 20    Date for OT Re-Evaluation 10/02/20    OT Start Time 1120    OT Stop Time 1220    OT Time Calculation (min) 60 min    Activity Tolerance Patient tolerated treatment well    Behavior During Therapy [facility] for tasks assessed/performed           Past Medical History:  Diagnosis Date  . Allergy   . Anxiety   . Bipolar disorder ([place])   . Connective tissue disorder ([place]) 2017  . Depression   . Eczema   . Heart murmur    as a child  . Hyperlipidemia    during 2 nd pregnancy  . PTSD (post-traumatic stress disorder)   . Scarlet fever    as  a child    Past Surgical History:  Procedure Laterality Date  . CHOLECYSTECTOMY  2013  . SHOULDER SURGERY  2019   bone spur  . WISDOM TOOTH EXTRACTION  2013, 2016    There were no vitals filed for this visit.   Subjective Assessment - 09/05/20 1543    Currently in Pain? No/denies            OT Education - 09/05/20 1543    Education Details Continued education on different factors that contribute to our ability to manage our time, along with specific time management tips/strategies, including procrastination    Person(s) Educated Patient    Methods Explanation;Handout    Comprehension Verbalized understanding            OT Short Term Goals - 09/05/20 1544      OT SHORT TERM GOAL #1   Status On-going      OT SHORT TERM GOAL #2   Status On-going      OT SHORT TERM GOAL #3   Status On-going      OT SHORT TERM GOAL #4   Status On-going         Group Session:  S: None noted*  O: Group began with a recap on strategies/tips learned from previous OT session focused on time management. Today's group continued to focus on topic of time management and reviewed additional strategies to manage and  create schedules that are more efficient and effective in meeting their needs. Group members also discussed specific strategies to overcome procrastination.   A: Nicole [name] continued to present as anxious and difficult to engage in group; pt was present however did not have camera turned on. Pt did respond x 1 with encouragement to engage, appeared attentive overall and receptive to additional strategies and suggestions provided to improve time management skills.  P: Continue to attend PHP OT group sessions 5x week for 4 weeks to promote daily structure, social engagement, and opportunities to develop and utilize adaptive strategies to maximize functional performance in preparation for safe transition and integration back into school, work, and the community. Plan to address topic of  self-esteem in next OT group session.   Plan - 09/05/20 1544    Occupational performance deficits (Please refer to evaluation for details): ADL's;IADL's;Rest and Sleep;Education;Work;Leisure;Social Participation    Body Structure / Function / Physical Skills ADL    Cognitive Skills Attention;Emotional;Energy/Drive;Learn;Memory;Perception;Problem Solve;Safety Awareness;Temperament/Personality;Thought;Understand    Psychosocial Skills Coping Strategies;Environmental  Adaptations;Habits;Interpersonal Interaction;Routines and Behaviors           Patient will benefit from skilled therapeutic intervention in order to improve the following deficits and impairments:   Body Structure / Function / Physical Skills: ADL Cognitive Skills: Attention,Emotional,Energy/Drive,Learn,Memory,Perception,Problem Solve,Safety Awareness,Temperament/Personality,Thought,Understand Psychosocial Skills: Coping Strategies,Environmental  Adaptations,Habits,Interpersonal Interaction,Routines and Behaviors   Visit Diagnosis: Difficulty coping  Frontal lobe and executive function deficit  Severe episode of recurrent major depressive disorder, without psychotic features [facility])    Problem List Patient Active Problem List   Diagnosis Date Noted  . Loss of weight 08/04/2019  . Nausea without vomiting 08/04/2019  . Diarrhea 08/04/2019  . Encounter for other preprocedural examination 08/04/2019  . Family history of breast cancer 10/29/2018  . Post concussion syndrome 07/18/2014  . Visual disturbances 07/18/2014  . Memory changes 07/18/2014  . New onset headache 07/18/2014  . Fatigue 07/18/2014    09/05/2020  Ponciano Ort, MOT, OTR/L  09/05/2020, 3:45 PM  [place], [place], [address] Phone: [phone]   Fax:  [phone]  Name: Nicole [name] MRN: [id number] Date of Birth: [birth year]

## 2020-09-05 NOTE — Progress Notes (Signed)
Virtual Visit via Video Note  I connected with Nicole [name] on 09/06/20 at  9:00 AM EDT by a video enabled telemedicine application and verified that I am speaking with the correct person using two identifiers.  Location: Patient:  Provider: Office   I discussed the limitations of evaluation and management by telemedicine and the availability of in person appointments. The patient expressed understanding and agreed to proceed.   I discussed the assessment and treatment plan with the patient. The patient was provided an opportunity to ask questions and all were answered. The patient agreed with the plan and demonstrated an understanding of the instructions.   The patient was advised to call back or seek an in-person evaluation if the symptoms worsen or if the condition fails to improve as anticipated.  I provided 15 minutes of non-face-to-face time during this encounter.   Nicole Center, NP    Behavioral Health Partial Program Assessment Note  Date: 09/05/2020 Name: Nicole [name] MRN: [id number]     [id number] [name] is a 40 y.o. African American female presents with worsing depression and work related issues.  Reports she was diagnosed with bipolar disorder and  ADHD.  She reports she was recently prescribed Strattera, Depakote and Doxepin.  States she does not feel any effects with this medication combination and she has been on this combination since [place].    Nicole [name] reports mood irritability, worthlessness and social isolation. Patient continues to endorse worsening depression and going thoughts that she may be losing her job when she returns from [employer]. Patient  is requesting to start antidepressant to help with racing thoughts and feeling of sadness. Patient reported she has been on multiple medication which hasn't been helpful. Stated she is not sleeping well.Patient stated she has tried olanzapine, Risperdal and Trazodone. Patient reported family  history with bipolar disorder denied miana or hypomania symptom. Nicole [name] reported previous inpatient when she was 54 and 40 years old. Discussed starting Abilify and will follow-up with reported sleeping issues. patient was enrolled in partial psychiatric program on 09/05/20.  Primary complaints include: anxiety, depression worse, feeling depressed, poor concentration and problem with medication.  Onset of symptoms was gradual with gradually worsening course since that time. Psychosocial Stressors include the following: financial.   I have reviewed the following documentation dated 09/05/2020: past psychiatric history, past medical history and past social and family history  Complaints of Pain: nonear Past Psychiatric History:  Past psychiatric hospitalizations  and Past medication trials   Currently in treatment with   Substance Abuse History: none Use of Alcohol: denied Use of Caffeine: denies use Use of over the counter:   Past Surgical History:  Procedure Laterality Date  . CHOLECYSTECTOMY  2013  . SHOULDER SURGERY  2019   bone spur  . WISDOM TOOTH EXTRACTION  2013, 2016    Past Medical History:  Diagnosis Date  . Allergy   . Anxiety   . Bipolar disorder ([place])   . Connective tissue disorder ([place]) 2017  . Depression   . Eczema   . Heart murmur    as a child  . Hyperlipidemia    during 2 nd pregnancy  . PTSD (post-traumatic stress disorder)   . Scarlet fever    as a child   Outpatient Encounter Medications as of 09/05/2020  Medication Sig Note  . divalproex (DEPAKOTE ER) 500 MG 24 hr tablet Take 1,000 mg by mouth daily.   . methocarbamol (ROBAXIN) 500 MG tablet Take 1 tablet (500 mg total) by  mouth 3 (three) times daily. 09/05/2020: Taking PRN  . diclofenac (VOLTAREN) 75 MG EC tablet Take 1 tablet (75 mg total) by mouth 2 (two) times daily. (Patient not taking: Reported on 09/05/2020)   . hydroxychloroquine (PLAQUENIL) 200 MG tablet Take 200 mg by mouth 2 (two) times  daily. (Patient not taking: Reported on 09/05/2020)    No facility-administered encounter medications on file as of 09/05/2020.   Allergies  Allergen Reactions  . Latex Hives  . Mangifera Indica Hives  . Mango Flavor   . Strawberry Extract Hives    Social History   Tobacco Use  . Smoking status: Current Some Day Smoker    Packs/day: 0.25    Years: 10.00    Pack years: 2.50    Types: Cigarettes  . Smokeless tobacco: Never Used  Substance Use Topics  . Alcohol use: Yes    Alcohol/week: 0.0 standard drinks    Comment: occasional    Functioning Relationships: good support system Education: College       Please specify degree:  Other Pertinent History: None Family History  Problem Relation Age of Onset  . Lupus Mother   . High Cholesterol Mother   . Diabetes Father   . Healthy Daughter   . Healthy Son   . Bipolar disorder Maternal Uncle   . Bipolar disorder Maternal Grandmother   . Schizophrenia Maternal Grandmother   . Bipolar disorder Cousin   . Colon cancer Neg Hx   . Esophageal cancer Neg Hx   . Rectal cancer Neg Hx   . Stomach cancer Neg Hx      Review of Systems Constitutional: negative  Objective:  There were no vitals filed for this visit.  Physical Exam:   Mental Status Exam: Appearance:  Well groomed Psychomotor::  Within Normal Limits Attention span and concentration: Normal Behavior: calm, cooperative and adequate rapport can be established Speech:  normal volume Mood:  Depressed,anxiouse  Affect:  normal Thought Process:  Coherent Thought Content:  Logical Orientation:  person, place and time/date Cognition:  grossly intact Insight:  Intact Judgment:  Intact Estimate of Intelligence: Average Fund of knowledge: Aware of current events Memory: Recent and remote intact Abnormal movements: None Gait and station: Normal  Assessment:  Diagnosis: Severe episode of recurrent major depressive disorder, without psychotic features ([place])  [F33.2] 1. Severe episode of recurrent major depressive disorder, without psychotic features ([place])     Indications for admission: inpatient care required if not in partial hospital program  Plan:  Orders placed for occupational therapy  patient enrolled in Partial Hospitalization Program, patient's current medications are to be continued, the following medications are being prescribed Abilify mg with tiration, a comprehensive treatment plan will be developed and side effects of medications have been reviewed with patient  Treatment options and alternatives reviewed with patient and patient understands the above plan. Treatment place was reviewed and agreed upon by NP T.Antwain Caliendo and patient Nicole [name] need for group services.     Nicole Center, NP

## 2020-09-05 NOTE — Progress Notes (Signed)
Spoke with patient via Webex video call, used 2 identifiers to correctly identify patient. This is her first time in PHP as recommended by her therapist. Depression is affecting her ability to work, she has trouble sleeping and stress from her relationship with her husband. This is also the first year without her kids in the home. The older children moved out and 1 lives with her father. Had 2 inpatient psychiatric hospitalizations many years ago when she was 57 and 40 years of age. Has been diagnosed with unspecified connective tissue disease that causes some chronic pain all over. Denies SI/HI or AV hallucinations. On scale 1-10 as 10 being worst she rates depression at 6/7 and anxiety at 3. PHQ9=21. No other issues or complaints. Groups are going well.

## 2020-09-06 ENCOUNTER — Encounter (HOSPITAL_COMMUNITY): Payer: Self-pay

## 2020-09-06 ENCOUNTER — Other Ambulatory Visit (HOSPITAL_COMMUNITY): Payer: No Typology Code available for payment source | Admitting: Occupational Therapy

## 2020-09-06 ENCOUNTER — Other Ambulatory Visit: Payer: Self-pay

## 2020-09-06 ENCOUNTER — Other Ambulatory Visit (HOSPITAL_COMMUNITY): Payer: No Typology Code available for payment source | Admitting: Licensed Clinical Social Worker

## 2020-09-06 DIAGNOSIS — R4589 Other symptoms and signs involving emotional state: Secondary | ICD-10-CM

## 2020-09-06 DIAGNOSIS — F319 Bipolar disorder, unspecified: Secondary | ICD-10-CM

## 2020-09-06 DIAGNOSIS — R41844 Frontal lobe and executive function deficit: Secondary | ICD-10-CM

## 2020-09-06 DIAGNOSIS — F332 Major depressive disorder, recurrent severe without psychotic features: Secondary | ICD-10-CM

## 2020-09-06 NOTE — Therapy (Signed)
[place], [place], [address] Phone: [phone]   Fax:  [phone]  Occupational Therapy Treatment  Patient Details  Name: Nicole [name] MRN: [id number] Date of Birth: 1980/08/16 Referring Provider (OT): Ricky Ala   Encounter Date: 09/06/2020   OT End of Session - 09/06/20 1437    Visit Number 3    Number of Visits 20    Date for OT Re-Evaluation 10/02/20    OT Start Time 1200    OT Stop Time 1255    OT Time Calculation (min) 55 min    Activity Tolerance Patient tolerated treatment well    Behavior During Therapy [facility] for tasks assessed/performed           Past Medical History:  Diagnosis Date  . Allergy   . Anxiety   . Bipolar disorder ([place])   . Connective tissue disorder ([place]) 2017  . Depression   . Eczema   . Heart murmur    as a child  . Hyperlipidemia    during 2 nd pregnancy  . PTSD (post-traumatic stress disorder)   . Scarlet fever    as a child    Past Surgical History:  Procedure Laterality Date  . CHOLECYSTECTOMY  2013  . SHOULDER SURGERY  2019   bone spur  . WISDOM TOOTH EXTRACTION  2013, 2016    There were no vitals filed for this visit.   Subjective Assessment - 09/06/20 1437    Currently in Pain? No/denies           OT Education - 09/06/20 1437    Education Details Educated on low vs positive self-esteem and reviewed six strategies to boost low self-esteem    Person(s) Educated Patient    Methods Explanation;Handout    Comprehension Verbal cues required            OT Short Term Goals - 09/05/20 1544      OT SHORT TERM GOAL #1   Status On-going      OT SHORT TERM GOAL #2   Status On-going      OT SHORT TERM GOAL #3   Status On-going      OT SHORT TERM GOAL #4   Status On-going         Group Session:  S: None noted*  O: Today's group session encouraged increased engagement and participation through discussion focused on  self-esteem. Topic of discussion focused on identifying differences between low vs high self-esteem and identified ways in which our self-esteem impacts our daily lives including: self-criticism, ignoring positive qualities, negative emotions, work/school performance, relationships, leisure engagement, and self-care. Group members further identified ways in which their self-esteem directly impacts their daily function. Tips and strategies were shared to boost and build-up our low self-esteem, including exercise, mindfulness/meditation, postures of confidence, surrounding yourself with positive people, positive affirmations, and self-care. Group members committed to trying one of the above-mentioned strategies over their weekend.    A: Nicole [name] was present virtually in group space, however did not have camera on and did not engage verbally despite max verbal cues to do so. Will continue to encourage active engagement and participation in future sessions. Will also relay message to treatment team in the AM to discuss.   P: Continue to attend PHP OT group sessions 5x week for 4 weeks to promote daily structure, social engagement, and opportunities to develop and utilize adaptive strategies to maximize functional performance in preparation for safe transition and  integration back into school, work, and the community.    Plan - 09/06/20 1438    Occupational performance deficits (Please refer to evaluation for details): ADL's;IADL's;Rest and Sleep;Education;Work;Leisure;Social Participation    Body Structure / Function / Physical Skills ADL    Cognitive Skills Attention;Emotional;Energy/Drive;Learn;Memory;Perception;Problem Solve;Safety Awareness;Temperament/Personality;Thought;Understand    Psychosocial Skills Coping Strategies;Environmental  Adaptations;Habits;Interpersonal Interaction;Routines and Behaviors           Patient will benefit from skilled therapeutic intervention in order to improve the  following deficits and impairments:   Body Structure / Function / Physical Skills: ADL Cognitive Skills: Attention,Emotional,Energy/Drive,Learn,Memory,Perception,Problem Solve,Safety Awareness,Temperament/Personality,Thought,Understand Psychosocial Skills: Coping Strategies,Environmental  Adaptations,Habits,Interpersonal Interaction,Routines and Behaviors   Visit Diagnosis: Difficulty coping  Frontal lobe and executive function deficit  Severe episode of recurrent major depressive disorder, without psychotic features [facility])    Problem List Patient Active Problem List   Diagnosis Date Noted  . Loss of weight 08/04/2019  . Nausea without vomiting 08/04/2019  . Diarrhea 08/04/2019  . Encounter for other preprocedural examination 08/04/2019  . Family history of breast cancer 10/29/2018  . Post concussion syndrome 07/18/2014  . Visual disturbances 07/18/2014  . Memory changes 07/18/2014  . New onset headache 07/18/2014  . Fatigue 07/18/2014    09/06/2020  Ponciano Ort, MOT, OTR/L  09/06/2020, 2:39 PM  [facility] PARTIAL HOSPITALIZATION PROGRAM [place], [place], [address] Phone: [phone]   Fax:  [phone]  Name: Nicole [name] MRN: [id number] Date of Birth: 04-22-1981

## 2020-09-07 ENCOUNTER — Other Ambulatory Visit (HOSPITAL_COMMUNITY): Payer: No Typology Code available for payment source | Admitting: Licensed Clinical Social Worker

## 2020-09-07 ENCOUNTER — Encounter (HOSPITAL_COMMUNITY): Payer: Self-pay

## 2020-09-07 ENCOUNTER — Other Ambulatory Visit: Payer: Self-pay

## 2020-09-07 ENCOUNTER — Other Ambulatory Visit (HOSPITAL_COMMUNITY): Payer: No Typology Code available for payment source | Admitting: Occupational Therapy

## 2020-09-07 DIAGNOSIS — F332 Major depressive disorder, recurrent severe without psychotic features: Secondary | ICD-10-CM

## 2020-09-07 DIAGNOSIS — F319 Bipolar disorder, unspecified: Secondary | ICD-10-CM

## 2020-09-07 DIAGNOSIS — R41844 Frontal lobe and executive function deficit: Secondary | ICD-10-CM

## 2020-09-07 DIAGNOSIS — R4589 Other symptoms and signs involving emotional state: Secondary | ICD-10-CM

## 2020-09-07 NOTE — Therapy (Signed)
[place], [place], [address] Phone: [phone]   Fax:  [phone] Virtual Visit via Video Note  I connected with Nicole [name] on 09/07/20 at  11:00 AM EDT by a video enabled telemedicine application and verified that I am speaking with the correct person using two identifiers.  Location: Patient: Patient Home Provider: Clinic Office   I discussed the limitations of evaluation and management by telemedicine and the availability of in person appointments. The patient expressed understanding and agreed to proceed.   I discussed the assessment and treatment plan with the patient. The patient was provided an opportunity to ask questions and all were answered. The patient agreed with the plan and demonstrated an understanding of the instructions.   The patient was advised to call back or seek an in-person evaluation if the symptoms worsen or if the condition fails to improve as anticipated.  I provided 60 minutes of non-face-to-face time during this encounter.   Ponciano Ort, OT   Occupational Therapy Treatment  Patient Details  Name: Nicole [name] MRN: [id number] Date of Birth: [birth year] Referring Provider (OT): Ricky Ala   Encounter Date: 09/07/2020   OT End of Session - 09/07/20 1222    Visit Number 4    Number of Visits 20    Date for OT Re-Evaluation 10/02/20    OT Start Time 1100    OT Stop Time 1200    OT Time Calculation (min) 60 min    Activity Tolerance Patient tolerated treatment well    Behavior During Therapy [facility] for tasks assessed/performed           Past Medical History:  Diagnosis Date  . Allergy   . Anxiety   . Bipolar disorder ([place])   . Connective tissue disorder ([place]) 2017  . Depression   . Eczema   . Heart murmur    as a child  . Hyperlipidemia    during 2 nd pregnancy  . PTSD (post-traumatic stress disorder)   . Scarlet fever    as  a child    Past Surgical History:  Procedure Laterality Date  . CHOLECYSTECTOMY  2013  . SHOULDER SURGERY  2019   bone spur  . WISDOM TOOTH EXTRACTION  2013, 2016    There were no vitals filed for this visit.   Subjective Assessment - 09/07/20 1222    Currently in Pain? No/denies                                OT Education - 09/07/20 1222    Education Details Educated on goal-setting strategies and goal-setting with use of the SMART goal framework    Person(s) Educated Patient    Methods Explanation;Handout    Comprehension Verbal cues required            OT Short Term Goals - 09/05/20 1544      OT SHORT TERM GOAL #1   Status On-going      OT SHORT TERM GOAL #2   Status On-going      OT SHORT TERM GOAL #3   Status On-going      OT SHORT TERM GOAL #4   Status On-going         Group Session:  S: None noted*  O: Today's group session encouraged engagement and participation through discussion focused on goal setting. Group members were introduced to goal-setting using the  SMART Goal framework, identifying goals as Specific, Measureable, Acheivable, Relevant, and Time-Bound. Group members took time from group to create their own personal goal reflecting the SMART goal template and shared for review by peers and OT.     A: Natonya continues to present with difficulty engaging actively in group, camera is turned out and pt difficult to respond verbally when given mod verbal cues and encouragement. Pt did not share her experience with goal setting, however with max cues to engage, pt was able to respond in the chat box with her SMART goal "research and purchase a new washer/dryer in a month". Will continue to monitory progress and engagement and encourage participation in future sessions.   P: Continue to attend PHP OT group sessions 5x week for 4 weeks to promote daily structure, social engagement, and opportunities to develop and utilize adaptive  strategies to maximize functional performance in preparation for safe transition and integration back into school, work, and the community.    Plan - 09/07/20 1222    Occupational performance deficits (Please refer to evaluation for details): ADL's;IADL's;Rest and Sleep;Education;Work;Leisure;Social Participation    Body Structure / Function / Physical Skills ADL    Cognitive Skills Attention;Emotional;Energy/Drive;Learn;Memory;Perception;Problem Solve;Safety Awareness;Temperament/Personality;Thought;Understand    Psychosocial Skills Coping Strategies;Environmental  Adaptations;Habits;Interpersonal Interaction;Routines and Behaviors           Patient will benefit from skilled therapeutic intervention in order to improve the following deficits and impairments:   Body Structure / Function / Physical Skills: ADL Cognitive Skills: Attention,Emotional,Energy/Drive,Learn,Memory,Perception,Problem Solve,Safety Awareness,Temperament/Personality,Thought,Understand Psychosocial Skills: Coping Strategies,Environmental  Adaptations,Habits,Interpersonal Interaction,Routines and Behaviors   Visit Diagnosis: Difficulty coping  Frontal lobe and executive function deficit  Severe episode of recurrent major depressive disorder, without psychotic features [facility])    Problem List Patient Active Problem List   Diagnosis Date Noted  . Loss of weight 08/04/2019  . Nausea without vomiting 08/04/2019  . Diarrhea 08/04/2019  . Encounter for other preprocedural examination 08/04/2019  . Family history of breast cancer 10/29/2018  . Post concussion syndrome 07/18/2014  . Visual disturbances 07/18/2014  . Memory changes 07/18/2014  . New onset headache 07/18/2014  . Fatigue 07/18/2014    09/07/2020  Ponciano Ort, MOT, OTR/L  09/07/2020, 12:23 PM  [place], [place], [address] Phone: [phone]   Fax:   [phone]  Name: Nicole [name] MRN: [id number] Date of Birth: May 26, 1980

## 2020-09-08 ENCOUNTER — Encounter (HOSPITAL_COMMUNITY): Payer: Self-pay

## 2020-09-08 ENCOUNTER — Other Ambulatory Visit: Payer: Self-pay

## 2020-09-08 ENCOUNTER — Other Ambulatory Visit (HOSPITAL_COMMUNITY): Payer: No Typology Code available for payment source | Admitting: Occupational Therapy

## 2020-09-08 ENCOUNTER — Other Ambulatory Visit (HOSPITAL_COMMUNITY): Payer: No Typology Code available for payment source | Admitting: Licensed Clinical Social Worker

## 2020-09-08 DIAGNOSIS — R4589 Other symptoms and signs involving emotional state: Secondary | ICD-10-CM | POA: Diagnosis not present

## 2020-09-08 DIAGNOSIS — F332 Major depressive disorder, recurrent severe without psychotic features: Secondary | ICD-10-CM

## 2020-09-08 DIAGNOSIS — R41844 Frontal lobe and executive function deficit: Secondary | ICD-10-CM

## 2020-09-08 NOTE — Therapy (Signed)
[place], [place], [address] Phone: [phone]   Fax:  [phone] Virtual Visit via Video Note  I connected with Nicole [name] on 09/08/20 at  11:00 AM EDT by a video enabled telemedicine application and verified that I am speaking with the correct person using two identifiers.  Location: Patient: Patient Home Provider: Clinic Office   I discussed the limitations of evaluation and management by telemedicine and the availability of in person appointments. The patient expressed understanding and agreed to proceed.   I discussed the assessment and treatment plan with the patient. The patient was provided an opportunity to ask questions and all were answered. The patient agreed with the plan and demonstrated an understanding of the instructions.   The patient was advised to call back or seek an in-person evaluation if the symptoms worsen or if the condition fails to improve as anticipated.  I provided 60 minutes of non-face-to-face time during this encounter.   Ponciano Ort, OT   Occupational Therapy Treatment  Patient Details  Name: Nicole [name] MRN: [id number] Date of Birth: [birth year] Referring Provider (OT): Ricky Ala   Encounter Date: 09/08/2020   OT End of Session - 09/08/20 1425    Visit Number 5    Number of Visits 20    Date for OT Re-Evaluation 10/02/20    OT Start Time 1105    OT Stop Time 1205    OT Time Calculation (min) 60 min    Activity Tolerance Patient tolerated treatment well    Behavior During Therapy [facility] for tasks assessed/performed           Past Medical History:  Diagnosis Date  . Allergy   . Anxiety   . Bipolar disorder ([place])   . Connective tissue disorder ([place]) 2017  . Depression   . Eczema   . Heart murmur    as a child  . Hyperlipidemia    during 2 nd pregnancy  . PTSD (post-traumatic stress disorder)   . Scarlet fever    as  a child    Past Surgical History:  Procedure Laterality Date  . CHOLECYSTECTOMY  2013  . SHOULDER SURGERY  2019   bone spur  . WISDOM TOOTH EXTRACTION  2013, 2016    There were no vitals filed for this visit.   Subjective Assessment - 09/08/20 1425    Currently in Pain? No/denies                                OT Education - 09/08/20 1425    Education Details Educated on fight-or-flight response and use of relaxation strategies including deep breathing, guided imagery, and PMR    Person(s) Educated Patient    Methods Explanation;Handout    Comprehension Verbal cues required            OT Short Term Goals - 09/05/20 1544      OT SHORT TERM GOAL #1   Status On-going      OT SHORT TERM GOAL #2   Status On-going      OT SHORT TERM GOAL #3   Status On-going      OT SHORT TERM GOAL #4   Status On-going         Group Session:  S: "I like to read and watch TV, kind of whatever is on when I turn the TV on."  O:  Group  began with a warm-up activity and group members were encouraged to share and identify ways in which they have practiced relaxation in the past, including any specific strategies or hobbies. Today's discussion focused on the topic of RELAXATION and patients reviewed and engaged in a variety of relaxation strategies techniques including deep breathing, guided imagery/meditation, and progressive muscle relaxation. After each strategy was reviewed, group members were invited to engage in an active practice of relaxation strategies identified. Review and background on the fight-or-flight response was also provided.   A: Persais was more engaged than noted and observed in previous sessions, however continues to require strong encouragement to do so. She shared that she likes to read and watch TV as current ways to relax. Pt appeared receptive to strategies reviewed, however when prompted to identify what new strategy she would like to practice  moving forward, pt declined to respond. She also declined to identify ways in which she may relax over the weekend. Will continue to encourage active participation and engagement in future sessions.   P: Continue to attend PHP OT group sessions 5x week for 2 weeks to promote daily structure, social engagement, and opportunities to develop and utilize adaptive strategies to maximize functional performance in preparation for safe transition and integration back into school, work, and the community.    Plan - 09/08/20 1426    Occupational performance deficits (Please refer to evaluation for details): ADL's;IADL's;Rest and Sleep;Education;Work;Leisure;Social Participation    Body Structure / Function / Physical Skills ADL    Cognitive Skills Attention;Emotional;Energy/Drive;Learn;Memory;Perception;Problem Solve;Safety Awareness;Temperament/Personality;Thought;Understand    Psychosocial Skills Coping Strategies;Environmental  Adaptations;Habits;Interpersonal Interaction;Routines and Behaviors           Patient will benefit from skilled therapeutic intervention in order to improve the following deficits and impairments:   Body Structure / Function / Physical Skills: ADL Cognitive Skills: Attention,Emotional,Energy/Drive,Learn,Memory,Perception,Problem Solve,Safety Awareness,Temperament/Personality,Thought,Understand Psychosocial Skills: Coping Strategies,Environmental  Adaptations,Habits,Interpersonal Interaction,Routines and Behaviors   Visit Diagnosis: Difficulty coping  Frontal lobe and executive function deficit  Severe episode of recurrent major depressive disorder, without psychotic features [facility])    Problem List Patient Active Problem List   Diagnosis Date Noted  . Loss of weight 08/04/2019  . Nausea without vomiting 08/04/2019  . Diarrhea 08/04/2019  . Encounter for other preprocedural examination 08/04/2019  . Family history of breast cancer 10/29/2018  . Post concussion  syndrome 07/18/2014  . Visual disturbances 07/18/2014  . Memory changes 07/18/2014  . New onset headache 07/18/2014  . Fatigue 07/18/2014    09/08/2020  Ponciano Ort, MOT, OTR/L  09/08/2020, 2:26 PM  [place], [place], [address] Phone: [phone]   Fax:  [phone]  Name: Charle Mclaurin MRN: [id number] Date of Birth: 1981-04-15

## 2020-09-11 ENCOUNTER — Encounter (HOSPITAL_COMMUNITY): Payer: Self-pay

## 2020-09-11 ENCOUNTER — Other Ambulatory Visit (HOSPITAL_COMMUNITY): Payer: No Typology Code available for payment source | Admitting: Occupational Therapy

## 2020-09-11 ENCOUNTER — Other Ambulatory Visit (HOSPITAL_COMMUNITY)
Payer: No Typology Code available for payment source | Attending: Psychiatry | Admitting: Licensed Clinical Social Worker

## 2020-09-11 ENCOUNTER — Other Ambulatory Visit: Payer: Self-pay

## 2020-09-11 DIAGNOSIS — F314 Bipolar disorder, current episode depressed, severe, without psychotic features: Secondary | ICD-10-CM | POA: Insufficient documentation

## 2020-09-11 DIAGNOSIS — F332 Major depressive disorder, recurrent severe without psychotic features: Secondary | ICD-10-CM

## 2020-09-11 DIAGNOSIS — R41844 Frontal lobe and executive function deficit: Secondary | ICD-10-CM

## 2020-09-11 DIAGNOSIS — F319 Bipolar disorder, unspecified: Secondary | ICD-10-CM

## 2020-09-11 DIAGNOSIS — F17210 Nicotine dependence, cigarettes, uncomplicated: Secondary | ICD-10-CM | POA: Insufficient documentation

## 2020-09-11 DIAGNOSIS — R4589 Other symptoms and signs involving emotional state: Secondary | ICD-10-CM

## 2020-09-11 DIAGNOSIS — Z79899 Other long term (current) drug therapy: Secondary | ICD-10-CM | POA: Diagnosis not present

## 2020-09-11 NOTE — Therapy (Signed)
[place], [place], [address] Phone: [phone]   Fax:  [phone] Virtual Visit via Video Note  I connected with Nicole [name] on 09/11/20 at  11:00 AM EDT by a video enabled telemedicine application and verified that I am speaking with the correct person using two identifiers.  Location: Patient: Patient Home Provider:  Clinic Office    I discussed the limitations of evaluation and management by telemedicine and the availability of in person appointments. The patient expressed understanding and agreed to proceed.   I discussed the assessment and treatment plan with the patient. The patient was provided an opportunity to ask questions and all were answered. The patient agreed with the plan and demonstrated an understanding of the instructions.   The patient was advised to call back or seek an in-person evaluation if the symptoms worsen or if the condition fails to improve as anticipated.  I provided 60 minutes of non-face-to-face time during this encounter.   Ponciano Ort, OT   Occupational Therapy Treatment  Patient Details  Name: Nicole [name] MRN: [id number] Date of Birth: 07-29-1980 Referring Provider (OT): Ricky Ala   Encounter Date: 09/11/2020   OT End of Session - 09/11/20 1213    Visit Number 6    Number of Visits 20    Date for OT Re-Evaluation 10/02/20    OT Start Time 1100    OT Stop Time 1200    OT Time Calculation (min) 60 min    Activity Tolerance Patient tolerated treatment well    Behavior During Therapy [facility] for tasks assessed/performed           Past Medical History:  Diagnosis Date  . Allergy   . Anxiety   . Bipolar disorder ([place])   . Connective tissue disorder ([place]) 2017  . Depression   . Eczema   . Heart murmur    as a child  . Hyperlipidemia    during 2 nd pregnancy  . PTSD (post-traumatic stress disorder)   . Scarlet fever    as  a child    Past Surgical History:  Procedure Laterality Date  . CHOLECYSTECTOMY  2013  . SHOULDER SURGERY  2019   bone spur  . WISDOM TOOTH EXTRACTION  2013, 2016    There were no vitals filed for this visit.   Subjective Assessment - 09/11/20 1212    Currently in Pain? No/denies                                OT Education - 09/11/20 1212    Education Details Educated on concept of sensory modulation and self-soothing as coping strategies through use of the eight senses    Person(s) Educated Patient    Methods Explanation;Handout    Comprehension Verbal cues required            OT Short Term Goals - 09/05/20 1544      OT SHORT TERM GOAL #1   Status On-going      OT SHORT TERM GOAL #2   Status On-going      OT SHORT TERM GOAL #3   Status On-going      OT SHORT TERM GOAL #4   Status On-going         Group Session:  S: "I just took it easy this weekend, didn't really leave the house at all."  O: Today's group session  focused on topic of sensory modulation and self-soothing through use of the 8 senses. Discussion introduced the concept of sensory modulation and integration, focusing on how we can utilize our body and it's senses to self-soothe or cope, when we are experiencing an over or under-whelming sensation or feeling. Group members were introduced to a sensory diet checklist as a helpful tool/resource that can be utilized to identify what activities and strategies we prefer and do not prefer based upon our response to different stimulus. The concept of alerting vs calming activities was also introduced to understand how to counteract how we are feeling (Example: when we are feeling overwhelmed/stressed, engage in something calming. When we are feeling depressed/low energy, engage in something alerting). Group members engaged actively in discussion sharing their own personal sensory likes/dislikes.    A: Nicole [name] was active and independent at  the start of group, reflecting on the topic of relaxation from last week, and sharing that she took it easy this weekend as her strategy to relax. She was initially receptive and engaged in today's discussing, sharing that one self soothing strategy she uses is rocking in the rocking chair on her front porch. She shared that she did not know there was a benefit, but looking back it is something she does to calm herself down. Pt became less engaged as group progressed, however when provided with verbal encouragement, responded appropriately. Will continue to encourage independent and active participation in future sessions.   P: Continue to attend PHP OT group sessions 5x week for 2 weeks to promote daily structure, social engagement, and opportunities to develop and utilize adaptive strategies to maximize functional performance in preparation for safe transition and integration back into school, work, and the community. Plan to address topic of sensory modulation in next OT group session.   Plan - 09/11/20 1213    Occupational performance deficits (Please refer to evaluation for details): ADL's;IADL's;Rest and Sleep;Education;Work;Leisure;Social Participation    Body Structure / Function / Physical Skills ADL    Cognitive Skills Attention;Emotional;Energy/Drive;Learn;Memory;Perception;Problem Solve;Safety Awareness;Temperament/Personality;Thought;Understand    Psychosocial Skills Coping Strategies;Environmental  Adaptations;Habits;Interpersonal Interaction;Routines and Behaviors           Patient will benefit from skilled therapeutic intervention in order to improve the following deficits and impairments:   Body Structure / Function / Physical Skills: ADL Cognitive Skills: Attention,Emotional,Energy/Drive,Learn,Memory,Perception,Problem Solve,Safety Awareness,Temperament/Personality,Thought,Understand Psychosocial Skills: Coping Strategies,Environmental  Adaptations,Habits,Interpersonal  Interaction,Routines and Behaviors   Visit Diagnosis: Difficulty coping  Frontal lobe and executive function deficit  Severe episode of recurrent major depressive disorder, without psychotic features [facility])    Problem List Patient Active Problem List   Diagnosis Date Noted  . Loss of weight 08/04/2019  . Nausea without vomiting 08/04/2019  . Diarrhea 08/04/2019  . Encounter for other preprocedural examination 08/04/2019  . Family history of breast cancer 10/29/2018  . Post concussion syndrome 07/18/2014  . Visual disturbances 07/18/2014  . Memory changes 07/18/2014  . New onset headache 07/18/2014  . Fatigue 07/18/2014    09/11/2020  Ponciano Ort, MOT, OTR/L  09/11/2020, 12:13 PM  [place], [place], [address] Phone: [phone]   Fax:  [phone]  Name: Nicole [name] MRN: [id number] Date of Birth: May 05, 1981

## 2020-09-12 ENCOUNTER — Other Ambulatory Visit: Payer: Self-pay

## 2020-09-12 ENCOUNTER — Encounter (HOSPITAL_COMMUNITY): Payer: Self-pay

## 2020-09-12 ENCOUNTER — Other Ambulatory Visit (HOSPITAL_COMMUNITY): Payer: No Typology Code available for payment source | Admitting: Licensed Clinical Social Worker

## 2020-09-12 ENCOUNTER — Other Ambulatory Visit (HOSPITAL_COMMUNITY): Payer: No Typology Code available for payment source | Admitting: Occupational Therapy

## 2020-09-12 DIAGNOSIS — F332 Major depressive disorder, recurrent severe without psychotic features: Secondary | ICD-10-CM

## 2020-09-12 DIAGNOSIS — F319 Bipolar disorder, unspecified: Secondary | ICD-10-CM

## 2020-09-12 DIAGNOSIS — F314 Bipolar disorder, current episode depressed, severe, without psychotic features: Secondary | ICD-10-CM | POA: Diagnosis not present

## 2020-09-12 DIAGNOSIS — R41844 Frontal lobe and executive function deficit: Secondary | ICD-10-CM

## 2020-09-12 DIAGNOSIS — R4589 Other symptoms and signs involving emotional state: Secondary | ICD-10-CM

## 2020-09-12 MED ORDER — MIRTAZAPINE 7.5 MG PO TABS: 7.5000 mg | ORAL_TABLET | Freq: Every day | ORAL | 0 refills | Status: DC

## 2020-09-12 NOTE — Progress Notes (Signed)
Virtual Visit via Video Note  I connected with Nicole [name] on 09/13/20 at  9:00 AM EDT by a video enabled telemedicine application and verified that I am speaking with the correct person using two identifiers.  Location: Patient: Home Provider: Office   I discussed the limitations of evaluation and management by telemedicine and the availability of in person appointments. The patient expressed understanding and agreed to proceed.    I discussed the assessment and treatment plan with the patient. The patient was provided an opportunity to ask questions and all were answered. The patient agreed with the plan and demonstrated an understanding of the instructions.   The patient was advised to call back or seek an in-person evaluation if the symptoms worsen or if the condition fails to improve as anticipated.  I provided 15 minutes of non-face-to-face time during this encounter.   Derrill Center, NP   [facility] MD/PA/NP OP Progress Note  09/13/2020 1:22 PM Rand Boller  MRN:  [id number]  Evaluation:  Altha Sweitzer was seen and evaluated via WebEx.  She continues to present flat, guarded but pleasant.  Denying suicidal or homicidal ideations.  Denies auditory or visual hallucinations.  Patient was initiated on Abilify 5 mg she reports taking and tolerating medication well.  Reports ongoing mood irritability and depression.  Geraldin rates her depression 8 out of 10 with 10 being the worst.   Denies that she is feeling any medication effects from starting Abilify last week.  Continues to report inability to sleep.  Patient is currently prescribed doxepin which she reports is too sedated and she often wakes up with a hangover in the morning.  Discussed discontinuing doxepin and initiate Remeron 7.5 mg nightly for mood stabilization.  Patient was receptive to plan.  NP will follow-up on 09/15/2020.  Support, encouragement and  reassurance was provided.  Visit Diagnosis:     ICD-10-CM   1. Bipolar 1 disorder, depressed ([place])  F31.9   2. Severe episode of recurrent major depressive disorder, without psychotic features ([place])  F33.2     Past Psychiatric History:   Past Medical History:  Past Medical History:  Diagnosis Date  . Allergy   . Anxiety   . Bipolar disorder ([place])   . Connective tissue disorder ([place]) 2017  . Depression   . Eczema   . Heart murmur    as a child  . Hyperlipidemia    during 2 nd pregnancy  . PTSD (post-traumatic stress disorder)   . Scarlet fever    as a child    Past Surgical History:  Procedure Laterality Date  . CHOLECYSTECTOMY  2013  . SHOULDER SURGERY  2019   bone spur  . WISDOM TOOTH EXTRACTION  2013, 2016    Family Psychiatric History:   Family History:  Family History  Problem Relation Age of Onset  . Lupus Mother   . High Cholesterol Mother   . Diabetes Father   . Healthy Daughter   . Healthy Son   . Bipolar disorder Maternal Uncle   . Bipolar disorder Maternal Grandmother   . Schizophrenia Maternal Grandmother   . Bipolar disorder Cousin   . Colon cancer Neg Hx   . Esophageal cancer Neg Hx   . Rectal cancer Neg Hx   . Stomach cancer Neg Hx     Social History:  Social History   Socioeconomic History  . Marital status: Married    Spouse name: Bayard Males  . Number of children: 3  .  Years of education: Not on file  . Highest education level: Some college, no degree  Occupational History    Employer: [employer]  Tobacco Use  . Smoking status: Current Some Day Smoker    Packs/day: 0.25    Years: 10.00    Pack years: 2.50    Types: Cigarettes  . Smokeless tobacco: Never Used  Vaping Use  . Vaping Use: Never used  Substance and Sexual Activity  . Alcohol use: Yes    Alcohol/week: 0.0 standard drinks    Comment: occasional   . Drug use: No  . Sexual activity: Yes  Other Topics Concern  . Not on file  Social History Narrative   ** Merged History Encounter **       Patient lives at home  with her spouse and children. Caffeine Use:  daily   Social Determinants of Health   Financial Resource Strain: Not on file  Food Insecurity: Not on file  Transportation Needs: Not on file  Physical Activity: Not on file  Stress: Not on file  Social Connections: Not on file    Allergies:  Allergies  Allergen Reactions  . Latex Hives  . Mangifera Indica Hives  . Mango Flavor   . Strawberry Extract Hives    Metabolic Disorder Labs: No results found for: HGBA1C, MPG No results found for: PROLACTIN No results found for: CHOL, TRIG, HDL, CHOLHDL, VLDL, LDLCALC Lab Results  Component Value Date   TSH 1.550 12/24/2019   TSH 1.800 07/27/2019    Therapeutic Level Labs: No results found for: LITHIUM No results found for: VALPROATE No components found for:  CBMZ  Current Medications: Current Outpatient Medications  Medication Sig Dispense Refill  . ARIPiprazole (ABILIFY) 5 MG tablet Take 1 tablet (5 mg total) by mouth daily. 30 tablet 0  . atomoxetine (STRATTERA) 40 MG capsule Take 40 mg by mouth daily.    . divalproex (DEPAKOTE ER) 500 MG 24 hr tablet Take 1,000 mg by mouth daily.    . methocarbamol (ROBAXIN) 500 MG tablet Take 1 tablet (500 mg total) by mouth 3 (three) times daily. 30 tablet 1  . mirtazapine (REMERON) 7.5 MG tablet Take 1 tablet (7.5 mg total) by mouth at bedtime. 30 tablet 0  . diclofenac (VOLTAREN) 75 MG EC tablet Take 1 tablet (75 mg total) by mouth 2 (two) times daily. (Patient not taking: No sig reported) 30 tablet 01  . hydroxychloroquine (PLAQUENIL) 200 MG tablet Take 200 mg by mouth 2 (two) times daily. (Patient not taking: No sig reported)     No current facility-administered medications for this visit.     Musculoskeletal: Strength & Muscle Tone: within normal limits Gait & Station: normal Patient leans: N/A  Psychiatric Specialty Exam: Review of Systems  There were no vitals taken for this visit.There is no height or weight on file to  calculate BMI.  General Appearance: Casual  Eye Contact:  Good  Speech:  Clear and Coherent  Volume:  Normal  Mood:  Anxious and Depressed  Affect:  Congruent  Thought Process:  Coherent  Orientation:  Full (Time, Place, and Person)  Thought Content: WDL   Suicidal Thoughts:  No  Homicidal Thoughts:  No  Memory:  Immediate;   Fair Recent;   Fair  Judgement:  Fair  Insight:  Fair  Psychomotor Activity:  Normal  Concentration:  Concentration: Fair  Recall:  [employer] of Knowledge: Fair  Language: Fair  Akathisia:  No  Handed:  Right  AIMS (  if indicated):   Assets:  Armed forces logistics/support/administrative officer Social Support  ADL's:  Intact  Cognition: WNL  Sleep:  Fair   Screenings: Science writer from 09/05/2020 in [place] Office Visit from 12/24/2019 in Primary Care at [facility] Video Visit from 07/26/2019 in Primary Care at [place] from 04/12/2019 in [place] at [place] from 10/29/2018 in Primary Care at [facility]  PHQ-2 Total Score 6 0 0 0 0  PHQ-9 Total Score 21 -- -- -- --    Flowsheet Row Counselor from 09/05/2020 in [place] Error: Question 6 not populated       Assessment and Plan:  Continue partial hospitalization programming Discontinue Doxepin 10mg  p.o. nightly Start Remeron 7.5mg s p.o. nightly  Treatment plan was reviewed and agreed upon by NPT Seymone Forlenza and patient Margaruite [place]- [place] need for continued group services   Derrill Center, NP 09/13/2020, 1:22 PM

## 2020-09-12 NOTE — Therapy (Signed)
[place], [place], [address] Phone: [phone]   Fax:  [phone] Virtual Visit via Video Note  I connected with Nicole [name] on 09/12/20 at  11:00 AM EDT by a video enabled telemedicine application and verified that I am speaking with the correct person using two identifiers.  Location: Patient: Patient Home Provider: Clinic Office   I discussed the limitations of evaluation and management by telemedicine and the availability of in person appointments. The patient expressed understanding and agreed to proceed.   I discussed the assessment and treatment plan with the patient. The patient was provided an opportunity to ask questions and all were answered. The patient agreed with the plan and demonstrated an understanding of the instructions.   The patient was advised to call back or seek an in-person evaluation if the symptoms worsen or if the condition fails to improve as anticipated.  I provided 65 minutes of non-face-to-face time during this encounter.   Nicole [name], OT   Occupational Therapy Treatment  Patient Details  Name: Nicole [name] MRN: [id number] Date of Birth: [birth year] Referring Provider (OT): Nicole [name]   Encounter Date: 09/12/2020   OT End of Session - 09/12/20 1520    Visit Number 7    Number of Visits 20    Date for OT Re-Evaluation 10/02/20    OT Start Time 1100    OT Stop Time 1205    OT Time Calculation (min) 65 min    Behavior During Therapy [facility] for tasks assessed/performed           Past Medical History:  Diagnosis Date  . Allergy   . Anxiety   . Bipolar disorder ([place])   . Connective tissue disorder ([place]) 2017  . Depression   . Eczema   . Heart murmur    as a child  . Hyperlipidemia    during 2 nd pregnancy  . PTSD (post-traumatic stress disorder)   . Scarlet fever    as a child    Past Surgical History:  Procedure  Laterality Date  . CHOLECYSTECTOMY  2013  . SHOULDER SURGERY  2019   bone spur  . WISDOM TOOTH EXTRACTION  2013, 2016    There were no vitals filed for this visit.   Subjective Assessment - 09/12/20 1520    Currently in Pain? No/denies           OT Education - 09/12/20 1520    Education Details Continued education on concept of sensory modulation and self-soothing as coping strategies through use of the eight senses    Person(s) Educated Patient    Methods Explanation;Handout    Comprehension Verbal cues required            OT Short Term Goals - 09/05/20 1544      OT SHORT TERM GOAL #1   Status On-going      OT SHORT TERM GOAL #2   Status On-going      OT SHORT TERM GOAL #3   Status On-going      OT SHORT TERM GOAL #4   Status On-going         Group Session:  S: "I find I need sound playing when I am home alone, like having the TV on or listening to loud music."  O: Today's group session continued to focus on topic of sensory modulation and self-soothing through use of the 8 senses. Discussion introduced the concept of sensory modulation and  integration, focusing on how we can utilize our body and it's senses to self-soothe or cope, when we are experiencing an over or under-whelming sensation or feeling. Group members were introduced to a sensory diet checklist as a helpful tool/resource that can be utilized to identify what activities and strategies we prefer and do not prefer based upon our response to different stimulus. The concept of alerting vs calming activities was also introduced to understand how to counteract how we are feeling (Example: when we are feeling overwhelmed/stressed, engage in something calming. When we are feeling depressed/low energy, engage in something alerting). Group members engaged actively in discussion sharing their own personal sensory likes/dislikes.    A: Nicole [name] was active and independent in her participation of discussion and  activity, a significant improvement observed and noted than previous sessions. Pt was spontaneous in her contributions and identified several calming activities as watching the stars, the smell of coffee, baked goods, and old car gasoline. Pt identified several alerting activities as listening to loud music and watching TV. Pt identified dislikes as seafood and seeing piles of leaves. Pt appeared receptive to additional strategies and activities reviewed.   P: Continue to attend PHP OT group sessions 5x week for 2 weeks to promote daily structure, social engagement, and opportunities to develop and utilize adaptive strategies to maximize functional performance in preparation for safe transition and integration back into school, work, and the community.    Plan - 09/12/20 1520    Occupational performance deficits (Please refer to evaluation for details): ADL's;IADL's;Rest and Sleep;Education;Work;Leisure;Social Participation    Body Structure / Function / Physical Skills ADL    Cognitive Skills Attention;Emotional;Energy/Drive;Learn;Memory;Perception;Problem Solve;Safety Awareness;Temperament/Personality;Thought;Understand    Psychosocial Skills Coping Strategies;Environmental  Adaptations;Habits;Interpersonal Interaction;Routines and Behaviors           Patient will benefit from skilled therapeutic intervention in order to improve the following deficits and impairments:   Body Structure / Function / Physical Skills: ADL Cognitive Skills: Attention,Emotional,Energy/Drive,Learn,Memory,Perception,Problem Solve,Safety Awareness,Temperament/Personality,Thought,Understand Psychosocial Skills: Coping Strategies,Environmental  Adaptations,Habits,Interpersonal Interaction,Routines and Behaviors   Visit Diagnosis: Difficulty coping  Frontal lobe and executive function deficit  Severe episode of recurrent major depressive disorder, without psychotic features [facility])    Problem List Patient Active  Problem List   Diagnosis Date Noted  . Loss of weight 08/04/2019  . Nausea without vomiting 08/04/2019  . Diarrhea 08/04/2019  . Encounter for other preprocedural examination 08/04/2019  . Family history of breast cancer 10/29/2018  . Post concussion syndrome 07/18/2014  . Visual disturbances 07/18/2014  . Memory changes 07/18/2014  . New onset headache 07/18/2014  . Fatigue 07/18/2014    09/12/2020  Nicole [name], MOT, OTR/L  09/12/2020, 3:21 PM  [facility] HOSPITALIZATION PROGRAM [place], [place], [address] Phone: [phone]   Fax:  [phone]  Name: Nicole [name] MRN: [id number] Date of Birth: 12/25/1980

## 2020-09-12 NOTE — Progress Notes (Signed)
Spoke with patient via Webex video call, used 2 identifiers to correctly identify patient. States she wakes frequently but is starting Remeron tonight to help with sleep. Groups are going well and she is enjoying them. Feeling a little more anxious today because she has been interviewing for new jobs. 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 On scale 1-10 as 10 being worst she rates anxiety at 5 and depression at 5/6. Denies SI/HI or AV hallucinations. No side effects from medications. No issues or complaints.

## 2020-09-13 ENCOUNTER — Other Ambulatory Visit (HOSPITAL_COMMUNITY): Payer: No Typology Code available for payment source

## 2020-09-13 ENCOUNTER — Other Ambulatory Visit: Payer: Self-pay

## 2020-09-13 ENCOUNTER — Encounter (HOSPITAL_COMMUNITY): Payer: Self-pay | Admitting: Family

## 2020-09-14 ENCOUNTER — Encounter (HOSPITAL_COMMUNITY): Payer: Self-pay

## 2020-09-14 ENCOUNTER — Other Ambulatory Visit (HOSPITAL_COMMUNITY): Payer: No Typology Code available for payment source | Admitting: Licensed Clinical Social Worker

## 2020-09-14 ENCOUNTER — Telehealth (HOSPITAL_COMMUNITY): Payer: Self-pay | Admitting: Psychiatry

## 2020-09-14 ENCOUNTER — Other Ambulatory Visit (HOSPITAL_COMMUNITY): Payer: No Typology Code available for payment source | Admitting: Occupational Therapy

## 2020-09-14 ENCOUNTER — Other Ambulatory Visit: Payer: Self-pay

## 2020-09-14 DIAGNOSIS — F332 Major depressive disorder, recurrent severe without psychotic features: Secondary | ICD-10-CM

## 2020-09-14 DIAGNOSIS — R4589 Other symptoms and signs involving emotional state: Secondary | ICD-10-CM

## 2020-09-14 DIAGNOSIS — R41844 Frontal lobe and executive function deficit: Secondary | ICD-10-CM

## 2020-09-14 DIAGNOSIS — F314 Bipolar disorder, current episode depressed, severe, without psychotic features: Secondary | ICD-10-CM | POA: Diagnosis not present

## 2020-09-14 DIAGNOSIS — F319 Bipolar disorder, unspecified: Secondary | ICD-10-CM
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
## 2020-09-14 NOTE — Psych (Signed)
Virtual Visit via Video Note  I connected with Nicole [name] on 09/04/20 at  9:00 AM EDT by a video enabled telemedicine application and verified that I am speaking with the correct person using two identifiers.  Location: Patient: patient home Provider: clinical home office   I discussed the limitations of evaluation and management by telemedicine and the availability of in person appointments. The patient expressed understanding and agreed to proceed.  I discussed the assessment and treatment plan with the patient. The patient was provided an opportunity to ask questions and all were answered. The patient agreed with the plan and demonstrated an understanding of the instructions.   The patient was advised to call back or seek an in-person evaluation if the symptoms worsen or if the condition fails to improve as anticipated.  Pt was provided 240 minutes of non-face-to-face time during this encounter.   Lorin Glass, LCSW    [facility] PHP THERAPIST PROGRESS NOTE  Nicole [name] [id number]  Session Time: 9:00 - 10:00  Participation Level: Active  Behavioral Response: CasualAlertDepressed  Type of Therapy: Group Therapy  Treatment Goals addressed: Coping  Interventions: CBT, DBT, Supportive and Reframing  Summary: Clinician led check-in regarding current stressors and situation.Clinician utilized active listening and empathetic response and validated patient emotions. Clinician facilitated processing group on pertinent issues.   Therapist Response:Nicole [name] is a 40 y.o. female who presents with depression symptoms. Patient arrived within time allowed and reports that she is feeling "okay." Patient rates hermood at a4.5on a scale of 1-10 with 10 being great. Pt reports she felt depressed and anxious over the weekend and didn't leave the house. Pt reports having some conflict with her husband and mom. Pt reports struggling with boundaries. Pt able  to process. Pt engaged in discussion.      Session Time: 10:00 - 11:00   Participation Level:Active  Behavioral Response:CasualAlertDepressed  Type of Therapy:GroupTherapy  Treatment Goals addressed: Coping  Interventions:CBT, DBT, Supportive and Reframing  Summary:Cln led discussion on setting boundaries as a way to increase self-care. Group members discussed things that are stumbling blocks to them engaging in self-care and worked to determine what boundary could address that stumbling block. Group worked together to determine how to address the boundary. Cln brought in topics of boundaries, assertiveness, thought challenging, and self-care.   Therapist Response:Pt engaged in discussion and identifies a boundary that can improve their self-care.     Session Time: 11:00- 12:00  Participation Level:Active  Behavioral Response:CasualAlertDepressed  Type of Therapy: Group Therapy, OT  Treatment Goals addressed: Coping  Interventions:Psychosocial skills training, Supportive  Summary:Occupational Therapy group  Therapist Response:Patient engaged in group. See OT note.      Session Time: 12:00 -1:00  Participation Level:Active  Behavioral Response:CasualAlertDepressed  Type of Therapy:Group therapy  Treatment Goals addressed: Coping  Interventions:CBT; Solution focused; Supportive; Reframing  Summary:12:00 - 12:50: Cln continued topic of DBT distress tolerance skills and the ACCEPTS distraction skill. Group reviewed C-C-E skills and discussed how they can practice them in their every day life.  12:50 -1:00 Clinician led check-out. Clinician assessed for immediate needs, medication compliance and efficacy, and safety concerns  Therapist Response:12:50 - 1:00: Pt engaged in discussion and reports she is most likely to practice the "C" skill.  12:50 - 1:00: At check-out, patientrates hermood at [facility] a scale of 1-10  with 10 being great.Pt reports afternoon plans ofwatching tv. Pt demonstrates some progress as evidenced byparticipation in first group session.Patient denies SI/HI at the end of group.  Suicidal/Homicidal: Nowithout intent/plan  Plan: Pt will continue in PHP while working to decrease depression symptoms and increase ability to manage symptoms in a healthy manner.   Diagnosis: Bipolar 1 disorder, depressed ([place]) [F31.9]    1. Bipolar 1 disorder, depressed ([place])   2. Severe episode of recurrent major depressive disorder, without psychotic features ([place])       Lorin Glass, LCSW 09/14/2020

## 2020-09-14 NOTE — Psych (Signed)
Virtual Visit via Video Note  I connected with Nicole [name] on 09/04/20 at  9:00 AM EDT by a video enabled telemedicine application and verified that I am speaking with the correct person using two identifiers.  Location: Patient: patient home Provider: clinical home office   I discussed the limitations of evaluation and management by telemedicine and the availability of in person appointments. The patient expressed understanding and agreed to proceed.  I discussed the assessment and treatment plan with the patient. The patient was provided an opportunity to ask questions and all were answered. The patient agreed with the plan and demonstrated an understanding of the instructions.   The patient was advised to call back or seek an in-person evaluation if the symptoms worsen or if the condition fails to improve as anticipated.  Cln and pt completed treatment plan and pt reports verbal alignment with plan. Pt states verbal consent to treatment and group commitments.   I provided 10 minutes of non-face-to-face time during this encounter.   Lorin Glass, LCSW

## 2020-09-14 NOTE — Therapy (Signed)
[place], [place], [address] Phone: [phone]   Fax:  [phone] Virtual Visit via Video Note  I connected with Nicole [name] on 09/14/20 at  11:00 AM EDT by a video enabled telemedicine application and verified that I am speaking with the correct person using two identifiers.  Location: Patient: Patient Home Provider: Clinic Office   I discussed the limitations of evaluation and management by telemedicine and the availability of in person appointments. The patient expressed understanding and agreed to proceed.   I discussed the assessment and treatment plan with the patient. The patient was provided an opportunity to ask questions and all were answered. The patient agreed with the plan and demonstrated an understanding of the instructions.   The patient was advised to call back or seek an in-person evaluation if the symptoms worsen or if the condition fails to improve as anticipated.  I provided 60 minutes of non-face-to-face time during this encounter.   Ponciano Ort, OT   Occupational Therapy Treatment  Patient Details  Name: Nicole [name] MRN: [id number] Date of Birth: 02-04-1981 Referring Provider (OT): Ricky Ala   Encounter Date: 09/14/2020   OT End of Session - 09/14/20 1303    Visit Number 8    Number of Visits 20    Date for OT Re-Evaluation 10/02/20    Authorization Type [employer]    OT Start Time 1115    OT Stop Time 1215    OT Time Calculation (min) 60 min    Activity Tolerance Patient tolerated treatment well    Behavior During Therapy [facility] for tasks assessed/performed           Past Medical History:  Diagnosis Date  . Allergy   . Anxiety   . Bipolar disorder ([place])   . Connective tissue disorder ([place]) 2017  . Depression   . Eczema   . Heart murmur    as a child  . Hyperlipidemia    during 2 nd pregnancy  . PTSD (post-traumatic  stress disorder)   . Scarlet fever    as a child    Past Surgical History:  Procedure Laterality Date  . CHOLECYSTECTOMY  2013  . SHOULDER SURGERY  2019   bone spur  . WISDOM TOOTH EXTRACTION  2013, 2016    There were no vitals filed for this visit.   Subjective Assessment - 09/14/20 1303    Currently in Pain? No/denies               OT Education - 09/14/20 1303    Education Details Educated on the 5 F's and provided resources/strategies/tips to improve overall health and wellness    Person(s) Educated Patient    Methods Explanation;Handout    Comprehension Verbalized understanding            OT Short Term Goals - 09/05/20 1544      OT SHORT TERM GOAL #1   Status On-going      OT SHORT TERM GOAL #2   Status On-going      OT SHORT TERM GOAL #3   Status On-going      OT SHORT TERM GOAL #4   Status On-going         Group Session:  S: "I have been struggling with sleep, but I just got sleep medicine that I started taking last night."  O: Today's group session focused on the topic of health and wellness as it relates to  the impact on mental health. Discussion focused on identifying the 5 F's to wellness including Food, Fitness, Fresh air, Fellowship, and Friendship with self and soul. Group members identified areas of wellness that they would like to improve upon and were educated and offered various resources. Discussion also focused on how the food we eat impacts our mental health, along with the benefits of engaging in physical activity/exercise and getting outside for fresh air. Discussion wrapped up with group members identifying one area of wellness they could improve upon and identified a strategy to do so.    A: Nicole [name] was active in her participation of discussion and activity, continuing to engage more actively and without cues needed from group leader. Pt identified that when it comes to her overall wellness, she struggles with her sleep and finding the  energy/motivation throughout the day to exercise. She appeared receptive to strategies and information received during session, identifying a goal of wanting to improve her self-care and friendship with herself. She shared that she has put herself on the back burner for a long time and doing something like getting a pedicure would be beneficial for her wellness.   P: Continue to attend PHP OT group sessions 5x week for the remainder of the week to promote daily structure, social engagement, and opportunities to develop and utilize adaptive strategies to maximize functional performance in preparation for safe transition and integration back into school, work, and the community. Plan to address topic of sleep hygiene in next OT group session.   Plan - 09/14/20 1303    Occupational performance deficits (Please refer to evaluation for details): ADL's;IADL's;Rest and Sleep;Education;Work;Leisure;Social Participation    Body Structure / Function / Physical Skills ADL    Cognitive Skills Attention;Emotional;Energy/Drive;Learn;Memory;Perception;Problem Solve;Safety Awareness;Temperament/Personality;Thought;Understand    Psychosocial Skills Coping Strategies;Environmental  Adaptations;Habits;Interpersonal Interaction;Routines and Behaviors           Patient will benefit from skilled therapeutic intervention in order to improve the following deficits and impairments:   Body Structure / Function / Physical Skills: ADL Cognitive Skills: Attention,Emotional,Energy/Drive,Learn,Memory,Perception,Problem Solve,Safety Awareness,Temperament/Personality,Thought,Understand Psychosocial Skills: Coping Strategies,Environmental  Adaptations,Habits,Interpersonal Interaction,Routines and Behaviors   Visit Diagnosis: Difficulty coping  Frontal lobe and executive function deficit  Severe episode of recurrent major depressive disorder, without psychotic features [facility])    Problem List Patient Active Problem List    Diagnosis Date Noted  . Loss of weight 08/04/2019  . Nausea without vomiting 08/04/2019  . Diarrhea 08/04/2019  . Encounter for other preprocedural examination 08/04/2019  . Family history of breast cancer 10/29/2018  . Post concussion syndrome 07/18/2014  . Visual disturbances 07/18/2014  . Memory changes 07/18/2014  . New onset headache 07/18/2014  . Fatigue 07/18/2014    09/14/2020  Ponciano Ort, MOT, OTR/L  09/14/2020, 1:04 PM  [place], [place], [address] Phone: [phone]   Fax:  [phone]  Name: Nicole [name] MRN: [id number] Date of Birth: July 02, 1980

## 2020-09-14 NOTE — Telephone Encounter (Signed)
D:  Patient will transition from [place] to [place] on 09-18-20.  A:  Placed call and oriented pt to MH-IOP.  Encouraged pt to verify her benefits.  R:  Pt receptive.

## 2020-09-15 ENCOUNTER — Other Ambulatory Visit (HOSPITAL_COMMUNITY): Payer: No Typology Code available for payment source | Admitting: Occupational Therapy

## 2020-09-15 ENCOUNTER — Other Ambulatory Visit: Payer: Self-pay

## 2020-09-15 ENCOUNTER — Other Ambulatory Visit (HOSPITAL_COMMUNITY): Payer: No Typology Code available for payment source | Admitting: Licensed Clinical Social Worker

## 2020-09-15 ENCOUNTER — Encounter (HOSPITAL_COMMUNITY): Payer: Self-pay

## 2020-09-15 DIAGNOSIS — F319 Bipolar disorder, unspecified: Secondary | ICD-10-CM

## 2020-09-15 DIAGNOSIS — F332 Major depressive disorder, recurrent severe without psychotic features: Secondary | ICD-10-CM

## 2020-09-15 DIAGNOSIS — R4589 Other symptoms and signs involving emotional state: Secondary | ICD-10-CM

## 2020-09-15 DIAGNOSIS — F314 Bipolar disorder, current episode depressed, severe, without psychotic features: Secondary | ICD-10-CM | POA: Diagnosis not present

## 2020-09-15 DIAGNOSIS — R41844 Frontal lobe and executive function deficit: Secondary | ICD-10-CM

## 2020-09-15 NOTE — Progress Notes (Signed)
Virtual Visit via Video Note  I connected with Nicole [name] on 09/15/20 at  9:00 AM EDT by a video enabled telemedicine application and verified that I am speaking with the correct person using two identifiers.  At orientation to the [facility] program, Case Manager discussed the limitations of evaluation and management by telemedicine and the availability of in person appointments. The patient expressed understanding and agreed to proceed with virtual visits throughout the duration of the program.   Location:  Patient: Patient Home Provider: Home Office   History of Present Illness: MDD and Bipolar DO  Observations/Objective: Check In: Case Manager checked in with all participants to review discharge dates, insurance authorizations, work-related documents and needs from the treatment team regarding medications. Client stated needs and engaged in discussion. Case Manager introduced 2 new Clients to the group with the group members beginning the joining process.   Initial Therapeutic Activity: Counselor facilitated a check-in with group members to assess mood and current functioning. Client shared details of their mental health management since our last session, including challenges and successes. Counselor engaged group in discussion, covering the following topics: preparing for mixed emotions of Mother's Day, psychological testing resources, importance of exercise and hydration, impact of MH on relationships, and childhood trauma. Client presents with moderate depression and moderate anxiety. Client denied any current SI/HI/psychosis.    Second Therapeutic Activity: Counselor engaged group in the creation of an ECOMap to assess current relationships, energy lines, dynamics and boundaries. Counselor provided instructions on how to create symbols to signify patterns. Counselor challenged group members to take a step back to notice themes and trends, strengths and areas for change to create  individualized goals. Group will present their ECOMaps during the next session.    Check Out: Counselor prompted group members to share what self-care practice or productivity activity they will engage over the weekend. Client plans to spend time with children. Client endorsed safety plan to be followed to prevent safety issues.   Assessment and Plan: Clinician recommends that Client remain in PHP treatment to better manage mental health symptoms, stabilization and to address treatment plan goals. Clinician recommends adherence to crisis/safety plan, taking medications as prescribed, and following up with medical professionals if any issues arise.   Follow Up Instructions: Clinician will send Webex link for next session. The Client was advised to call back or seek an in-person evaluation if the symptoms worsen or if the condition fails to improve as anticipated.     I provided 180 minutes of non-face-to-face time during this encounter.     Lise Auer, LCSW

## 2020-09-15 NOTE — Therapy (Signed)
[place], [place], [address] Phone: [phone]   Fax:  [phone] Virtual Visit via Video Note  I connected with Nicole [name] on 09/15/20 at  11:50 AM EDT by a video enabled telemedicine application and verified that I am speaking with the correct person using two identifiers.  Location: Patient: Patient Home Provider: Home Office   I discussed the limitations of evaluation and management by telemedicine and the availability of in person appointments. The patient expressed understanding and agreed to proceed.   I discussed the assessment and treatment plan with the patient. The patient was provided an opportunity to ask questions and all were answered. The patient agreed with the plan and demonstrated an understanding of the instructions.   The patient was advised to call back or seek an in-person evaluation if the symptoms worsen or if the condition fails to improve as anticipated.  I provided 60 minutes of non-face-to-face time during this encounter.   Ponciano Ort, OT   Occupational Therapy Treatment  Patient Details  Name: Nicole [name] MRN: [id number] Date of Birth: September 09, 1980 Referring Provider (OT): Ricky Ala   Encounter Date: 09/15/2020   OT End of Session - 09/15/20 1259    Visit Number 9    Number of Visits 20    Date for OT Re-Evaluation 10/02/20    Authorization Type [employer]    OT Start Time 1150    OT Stop Time 1250    OT Time Calculation (min) 60 min    Activity Tolerance Patient tolerated treatment well    Behavior During Therapy WFL for tasks assessed/performed           Past Medical History:  Diagnosis Date  . Allergy   . Anxiety   . Bipolar disorder ([place])   . Connective tissue disorder ([place]) 2017  . Depression   . Eczema   . Heart murmur    as a child  . Hyperlipidemia    during 2 nd pregnancy  . PTSD (post-traumatic  stress disorder)   . Scarlet fever    as a child    Past Surgical History:  Procedure Laterality Date  . CHOLECYSTECTOMY  2013  . SHOULDER SURGERY  2019   bone spur  . WISDOM TOOTH EXTRACTION  2013, 2016    There were no vitals filed for this visit.   Subjective Assessment - 09/15/20 1259    Currently in Pain? No/denies                                OT Education - 09/15/20 1259    Education Details Educated on sleep hygiene and strategies to improve overall sleep quality    Person(s) Educated Patient    Methods Explanation;Handout    Comprehension Verbalized understanding            OT Short Term Goals - 09/15/20 1259      OT SHORT TERM GOAL #1   Title Pt will actively engage in OT group sessions throughout duration of PHP programming, in order to promote daily structure, social engagement, and opportunities to develop and utilize adaptive strategies to maximize functional performance in preparation for safe transition and integration back into school, work, and the community.    Status Achieved      OT SHORT TERM GOAL #2   Title Pt will demonstrate improved ability to communicate feelings/needs/wants as evidenced by,  active participation in OT sessions, throughout duration of PHP programming, in order to safely transition back into the community at discharge.    Status Partially Met      OT SHORT TERM GOAL #3   Title Pt will identify 1-3 stress management strategies she can utilize, in order to safely manage increased psychosocial stressors identified, with min cues, in preparation for safe transition back to the community at discharge.    Status Achieved      OT SHORT TERM GOAL #4   Title Pt will identify and implement 1-3 sleep hygiene strategies she can utilize, in order to improve sleep quality/ADL performance, in preparation for safe and healthy reintegration back into the community at discharge.    Status Achieved         Group  Session:  S: "I don't have a sleep schedule or routine. I can't sleep at night and so I would fall asleep at work or doze off."  O: Today's group discussion focused on topic of Sleep Hygiene. Patients reflected on the quality of sleep they typically receive and identified areas that need improvement. Group was given background information on sleep and sleep hygiene, including common sleep disorders. Group members also received information on how to improve one's sleep and introduced a sleep diary as a tool that can be utilized to track sleep quality over a length of time. Group session ended with patients identifying one or more strategies they could utilize or implement into their sleep routine in order to improve overall sleep quality.    A: Nicole [name] was active and independent in her participation of today's session focused on sleep, sharing that she has difficulty with maintaining a sleep schedule, impacting her ability to stay asleep and get enough hours each night. She shared it is difficult to sleep in total darkness, so it is helpful to have the TV on with light and sound. She shared that her husband and her do not share a bed anymore because of how different their schedules/routines are. Pt was quite active and receptive to education and strategies offered, sharing that creating a routine and sticking to it would be most beneficial for her personal sleep hygiene.  P: Pt will discharge from [place] program effective today 09/15/2020 with plan to follow up and begin MHIOP on Monday 09/18/2020.  OCCUPATIONAL THERAPY DISCHARGE SUMMARY  Visits from Start of Care: 9  Current functional level related to goals / functional outcomes: Pt has achieved 3/4 OT goals and partially met 1/4. Pt has become increasingly more engaged and independent in her participation of sessions as her time in PHP progessed. Pt has also identified strategies in which she can improve her sleep quality and communicate with her family.  Pt is ready and agreeable to discharge, effective today 09/15/2020, with plan to follow up and begin MHIOP on Monday 09/18/2020 for ongoing treatment and therapy needs.    Remaining deficits: See above    Education / Equipment: See above   Plan: Patient agrees to discharge.  Patient goals were met. Patient is being discharged due to meeting the stated rehab goals.  ?????       Plan - 09/15/20 1259    Occupational performance deficits (Please refer to evaluation for details): ADL's;IADL's;Rest and Sleep;Education;Work;Leisure;Social Participation    Body Structure / Function / Physical Skills ADL    Cognitive Skills Attention;Emotional;Energy/Drive;Learn;Memory;Perception;Problem Solve;Safety Awareness;Temperament/Personality;Thought;Understand    Psychosocial Skills Coping Strategies;Environmental  Adaptations;Habits;Interpersonal Interaction;Routines and Behaviors  Patient will benefit from skilled therapeutic intervention in order to improve the following deficits and impairments:   Body Structure / Function / Physical Skills: ADL Cognitive Skills: Attention,Emotional,Energy/Drive,Learn,Memory,Perception,Problem Solve,Safety Awareness,Temperament/Personality,Thought,Understand Psychosocial Skills: Coping Strategies,Environmental  Adaptations,Habits,Interpersonal Interaction,Routines and Behaviors   Visit Diagnosis: Difficulty coping  Frontal lobe and executive function deficit  Severe episode of recurrent major depressive disorder, without psychotic features [facility])    Problem List Patient Active Problem List   Diagnosis Date Noted  . Loss of weight 08/04/2019  . Nausea without vomiting 08/04/2019  . Diarrhea 08/04/2019  . Encounter for other preprocedural examination 08/04/2019  . Family history of breast cancer 10/29/2018  . Post concussion syndrome 07/18/2014  . Visual disturbances 07/18/2014  . Memory changes 07/18/2014  . New onset headache 07/18/2014  .  Fatigue 07/18/2014    09/15/2020  Ponciano Ort, MOT, OTR/L  09/15/2020, 1:00 PM  [facility] HOSPITALIZATION PROGRAM [place], [place], [address] Phone: [phone]   Fax:  [phone]  Name: Nicole [name] MRN: [id number] Date of Birth: December 25, 1980

## 2020-09-18 ENCOUNTER — Other Ambulatory Visit (HOSPITAL_COMMUNITY): Payer: Self-pay

## 2020-09-18 ENCOUNTER — Other Ambulatory Visit: Payer: Self-pay

## 2020-09-18 ENCOUNTER — Ambulatory Visit (HOSPITAL_COMMUNITY): Payer: Self-pay

## 2020-09-18 ENCOUNTER — Encounter (HOSPITAL_COMMUNITY): Payer: Self-pay | Admitting: Psychiatry

## 2020-09-18 ENCOUNTER — Other Ambulatory Visit (HOSPITAL_COMMUNITY): Payer: No Typology Code available for payment source | Admitting: Psychiatry

## 2020-09-18 DIAGNOSIS — F319 Bipolar disorder, unspecified: Secondary | ICD-10-CM

## 2020-09-18 DIAGNOSIS — F314 Bipolar disorder, current episode depressed, severe, without psychotic features: Secondary | ICD-10-CM | POA: Diagnosis not present
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
## 2020-09-18 NOTE — Progress Notes (Signed)
Virtual Visit via Video Note  I connected with Nicole [name] on 09/18/20 at  9:00 AM EDT by a video enabled telemedicine application and verified that I am speaking with the correct person using two identifiers.  At orientation to the IOP program, Case Manager discussed the limitations of evaluation and management by telemedicine and the availability of in person appointments. The patient expressed understanding and agreed to proceed with virtual visits throughout the duration of the program.   Location:  Patient: Patient Home Provider: Home Office   History of Present Illness: Bipolar DO  Observations/Objective: Check In: Case Manager checked in with all participants to review discharge dates, insurance authorizations, work-related documents and needs from the treatment team regarding medications. Client stated needs and engaged in discussion.   Initial Therapeutic Activity: Counselor facilitated a check-in with group members to assess mood and current functioning. Client shared details of their mental health management since our last session, including challenges and successes. Counselor engaged group in discussion, covering the following topics: shifting approaches, assertive communication, family dynamics, advocating for MH, compulsions, mania, progress on goals, and stage of life issues. Client presents with moderate depression and moderate anxiety. Client denied any current SI/HI/psychosis.    Second Therapeutic Activity: Counselor engaged group in [employer] results, where they assessed current relationships, energy lines, dynamics and boundaries. Counselor challenged group members to take a step back to notice themes and trends, strengths and areas for change to create individualized goals. Group members who presented last week shared how they discussed their ECOMap with family or friends and how they approached relationships differently since our last session. Client engaged  appropriately in activity.    Check Out: Counselor prompted group members to identify and share who their mentors are, with Client stating their cousin is Dentist. Counselor prompted group members to share what self-care practice or productivity activity they will engage over the weekend. Client plans to rest and relax. Client endorsed safety plan to be followed to prevent safety issues.   Assessment and Plan: Clinician recommends that Client remain in IOP treatment to better manage mental health symptoms, stabilization and to address treatment plan goals. Clinician recommends adherence to crisis/safety plan, taking medications as prescribed, and following up with medical professionals if any issues arise.   Follow Up Instructions: Clinician will send Webex link for next session. The Client was advised to call back or seek an in-person evaluation if the symptoms worsen or if the condition fails to improve as anticipated.     I provided 180 minutes of non-face-to-face time during this encounter.     Lise Auer, LCSW

## 2020-09-19 ENCOUNTER — Other Ambulatory Visit (HOSPITAL_COMMUNITY): Payer: No Typology Code available for payment source | Admitting: Psychiatry

## 2020-09-19 ENCOUNTER — Other Ambulatory Visit: Payer: Self-pay

## 2020-09-19 ENCOUNTER — Ambulatory Visit (HOSPITAL_COMMUNITY): Payer: Self-pay

## 2020-09-19 ENCOUNTER — Other Ambulatory Visit (HOSPITAL_COMMUNITY): Payer: Self-pay

## 2020-09-19 DIAGNOSIS — F314 Bipolar disorder, current episode depressed, severe, without psychotic features: Secondary | ICD-10-CM | POA: Diagnosis not present

## 2020-09-19 DIAGNOSIS — F319 Bipolar disorder, unspecified: Secondary | ICD-10-CM

## 2020-09-20 ENCOUNTER — Encounter (HOSPITAL_COMMUNITY): Payer: Self-pay

## 2020-09-20 ENCOUNTER — Other Ambulatory Visit (HOSPITAL_COMMUNITY): Payer: No Typology Code available for payment source | Admitting: Psychiatry

## 2020-09-20 ENCOUNTER — Other Ambulatory Visit: Payer: Self-pay

## 2020-09-20 ENCOUNTER — Other Ambulatory Visit (HOSPITAL_COMMUNITY): Payer: Self-pay

## 2020-09-20 ENCOUNTER — Telehealth (HOSPITAL_COMMUNITY): Payer: Self-pay | Admitting: Psychiatry

## 2020-09-20 ENCOUNTER — Ambulatory Visit (HOSPITAL_COMMUNITY): Payer: Self-pay

## 2020-09-20 NOTE — Progress Notes (Signed)
Virtual Visit via Video Note  I connected with Nicole [name] on 09/19/20 at  9:00 AM EDT by a video enabled telemedicine application and verified that I am speaking with the correct person using two identifiers.  At orientation to the IOP program, Case Manager discussed the limitations of evaluation and management by telemedicine and the availability of in person appointments. The patient expressed understanding and agreed to proceed with virtual visits throughout the duration of the program.   Location:  Patient: Patient Home Provider: Home Office   History of Present Illness: Bipolar 1 DO  Observations/Objective: Check In: Case Manager checked in with all participants to review discharge dates, insurance authorizations, work-related documents and needs from the treatment team regarding medications. Client stated needs and engaged in discussion.   Initial Therapeutic Activity: Counselor facilitated a check-in with group members to assess mood and current functioning. Client shared details of their mental health management since our last session, including challenges and successes. Counselor engaged group in discussion, covering the following topics: shifting approaches, assertive communication, family dynamics, advocating for MH, compulsions, mania, progress on goals, and stage of life issues. Client presents with moderate depression and moderate anxiety. Client denied any current SI/HI/psychosis.   Second Therapeutic Activity: Counselor provided an therapeutic video on Locus of Control by Armando Reichert, Therapy in a Nutshell. Counselor and Group discussed takeaways and how to apply skills in real life situations. Counselor walked group through visual activity on how to process what is in their control, in their realm of influence and out of their control. Group members shared their scenarios and how they identified actions in each category. Client engaged well in process, asking good  questions and sharing results.    Check Out: Counselor prompted group members to share what self-care practice or productivity activity they will engage over the weekend. Client plans to organize her finances. Client endorsed safety plan to be followed to prevent safety issues.   Assessment and Plan: Clinician recommends that Client remain in IOP treatment to better manage mental health symptoms, stabilization and to address treatment plan goals. Clinician recommends adherence to crisis/safety plan, taking medications as prescribed, and following up with medical professionals if any issues arise.   Follow Up Instructions: Clinician will send Webex link for next session. The Client was advised to call back or seek an in-person evaluation if the symptoms worsen or if the condition fails to improve as anticipated.     I provided 180 minutes of non-face-to-face time during this encounter.     Lise Auer, LCSW

## 2020-09-21 ENCOUNTER — Other Ambulatory Visit (HOSPITAL_COMMUNITY): Payer: Self-pay

## 2020-09-21 ENCOUNTER — Ambulatory Visit (HOSPITAL_COMMUNITY): Payer: Self-pay

## 2020-09-21 ENCOUNTER — Encounter (HOSPITAL_COMMUNITY): Payer: Self-pay

## 2020-09-21 ENCOUNTER — Other Ambulatory Visit (HOSPITAL_COMMUNITY): Payer: No Typology Code available for payment source | Admitting: Psychiatry

## 2020-09-21 ENCOUNTER — Other Ambulatory Visit: Payer: Self-pay

## 2020-09-21 DIAGNOSIS — F319 Bipolar disorder, unspecified: Secondary | ICD-10-CM

## 2020-09-21 DIAGNOSIS — F314 Bipolar disorder, current episode depressed, severe, without psychotic features: Secondary | ICD-10-CM | POA: Diagnosis not present

## 2020-09-21 NOTE — Progress Notes (Signed)
Virtual Visit via Video Note  I connected with Nicole [name] on 09/21/20 at  9:00 AM EDT by a video enabled telemedicine application and verified that I am speaking with the correct person using two identifiers.  At orientation to the IOP program, Case Manager discussed the limitations of evaluation and management by telemedicine and the availability of in person appointments. The patient expressed understanding and agreed to proceed with virtual visits throughout the duration of the program.   Location:  Patient: Patient Home Provider: Home Office   History of Present Illness: MDD  Observations/Objective: Check In: Case Manager checked in with all participants to review discharge dates, insurance authorizations, work-related documents and needs from the treatment team regarding medications. Client stated needs and engaged in discussion. Case Manager introduce 2 new Clients to the group, as group members welcomed and started the joining process.   Initial Therapeutic Activity: Counselor facilitated a check-in with group members to assess mood and current functioning. Client shared details of their mental health management since our last session, including challenges and successes. Counselor engaged group in discussion, covering the following topics: self-esteem, body image/concept, financial stressors, implementing relaxation skills, and stress management. Client presents with severe depression and severe anxiety. Client denied any current SI/HI/psychosis.   Second Therapeutic Activity: Counselor introduced [employer], representative with [employer] to share about programming. Group Members asked questions and engaged in discussion, as Nicole [name] shared about Peer Support, Support Groups and the [employer]. Client stated that they are interested in connecting with the [facility] Support Group.   Check Out: Counselor closed program by allowing time to celebrate 2 graduating  group members. Counselor shared reflections on progress and allow space for group members to share well wishes and appreciates to the graduating client. Counselor prompted graduating client to share takeaways, reflect on progress and final thoughts for the group. Counselor prompted group members to share what self-care practice or productivity activity they will engage over the weekend. Client plans to call agencies about finances. Client endorsed safety plan to be followed to prevent safety issues.   Assessment and Plan: Clinician recommends that Client remain in IOP treatment to better manage mental health symptoms, stabilization and to address treatment plan goals. Clinician recommends adherence to crisis/safety plan, taking medications as prescribed, and following up with medical professionals if any issues arise.   Follow Up Instructions: Clinician will send Webex link for next session. The Client was advised to call back or seek an in-person evaluation if the symptoms worsen or if the condition fails to improve as anticipated.     I provided 180 minutes of non-face-to-face time during this encounter.     Lise Auer, LCSW

## 2020-09-22 ENCOUNTER — Other Ambulatory Visit (HOSPITAL_COMMUNITY): Payer: No Typology Code available for payment source | Admitting: Psychiatry

## 2020-09-22 ENCOUNTER — Ambulatory Visit (HOSPITAL_COMMUNITY): Payer: Self-pay

## 2020-09-22 ENCOUNTER — Other Ambulatory Visit: Payer: Self-pay

## 2020-09-22 ENCOUNTER — Other Ambulatory Visit (HOSPITAL_COMMUNITY): Payer: Self-pay

## 2020-09-22 DIAGNOSIS — F319 Bipolar disorder, unspecified: Secondary | ICD-10-CM

## 2020-09-22 DIAGNOSIS — F314 Bipolar disorder, current episode depressed, severe, without psychotic features: Secondary | ICD-10-CM | POA: Diagnosis not present

## 2020-09-25 ENCOUNTER — Other Ambulatory Visit: Payer: Self-pay

## 2020-09-25 ENCOUNTER — Encounter (HOSPITAL_COMMUNITY): Payer: Self-pay

## 2020-09-25 ENCOUNTER — Other Ambulatory Visit (HOSPITAL_COMMUNITY): Payer: No Typology Code available for payment source | Admitting: Psychiatry

## 2020-09-25 DIAGNOSIS — F319 Bipolar disorder, unspecified: Secondary | ICD-10-CM

## 2020-09-25 DIAGNOSIS — F314 Bipolar disorder, current episode depressed, severe, without psychotic features: Secondary | ICD-10-CM | POA: Diagnosis not present

## 2020-09-25 NOTE — Progress Notes (Signed)
Virtual Visit via Video Note  I connected with Nicole [name] on 09/22/20 at  9:00 AM EDT by a video enabled telemedicine application and verified that I am speaking with the correct person using two identifiers.  At orientation to the IOP program, Case Manager discussed the limitations of evaluation and management by telemedicine and the availability of in person appointments. The patient expressed understanding and agreed to proceed with virtual visits throughout the duration of the program.   Location:  Patient: Patient Home Provider: Home Office   History of Present Illness: Biploar 1 DO  Observations/Objective: Check In: Case Manager checked in with all participants to review discharge dates, insurance authorizations, work-related documents and needs from the treatment team regarding medications. Client stated needs and engaged in discussion.   Initial Therapeutic Activity: Counselor facilitated a check-in with group members to assess mood and current functioning. Client shared details of their mental health management since our last session, including challenges and successes. Counselor engaged group in discussion, covering the following topics: abandonment, superstitions, following safety plans, incorporating support system in a balanced way, and childhood traumas. Client presents with moderate depression and moderate anxiety. Client denied any current SI/HI/psychosis.   Second Therapeutic Activity: Counselor closed program by allowing time to celebrate a graduating group members. Counselor shared reflections on progress and allow space for group members to share well wishes and appreciates to the graduating client. Counselor prompted graduating client to share takeaways, reflect on progress and final thoughts for the group.   Check Out: Counselor prompted group members to share what self-care practice or productivity activity they will engage over the weekend. Client endorsed  safety plan to be followed to prevent safety issues.   Assessment and Plan: Clinician recommends that Client remain in IOP treatment to better manage mental health symptoms, stabilization and to address treatment plan goals. Clinician recommends adherence to crisis/safety plan, taking medications as prescribed, and following up with medical professionals if any issues arise.   Follow Up Instructions: Clinician will send Webex link for next session. The Client was advised to call back or seek an in-person evaluation if the symptoms worsen or if the condition fails to improve as anticipated.     I provided 180 minutes of non-face-to-face time during this encounter.     Lise Auer, LCSW

## 2020-09-25 NOTE — Progress Notes (Signed)
Virtual Visit via Video Note  I connected with Nicole [name] on 09/25/20 at  9:00 AM EDT by a video enabled telemedicine application and verified that I am speaking with the correct person using two identifiers.  At orientation to the IOP program, Case Manager discussed the limitations of evaluation and management by telemedicine and the availability of in person appointments. The patient expressed understanding and agreed to proceed with virtual visits throughout the duration of the program.   Location:  Patient: Patient Home Provider: Home Office   History of Present Illness: Bipolar DO  Observations/Objective: Check In: Case Manager checked in with all participants to review discharge dates, insurance authorizations, work-related documents and needs from the treatment team regarding medications. Client stated needs and engaged in discussion.   Initial Therapeutic Activity: Counselor facilitated a check-in with group members to assess mood and current functioning. Client shared details of their mental health management since our last session, including challenges and successes. Counselor engaged group in discussion, covering the following topics: stages and tasks of development, healing from trauma, supporting others with MH issues, and healthy coping skills. Client presents with moderate depression and moderate anxiety. Client denied any current SI/HI/psychosis.   Second Therapeutic Activity: Counselor presented information on Cognitive Distortions. Client shared a video outlining 15 styles of cognitive distortions. Group members shared which ones they do most in their thought processes. Client related to all, but specifically black and white thinking and catastrophizing. Counselor shared 5 strategies for combating distorted thinking. Counselor shared links for resources for Group Members to review for homework.  Check Out: Counselor prompted group members to share what self-care  practice or productivity activity they will engage today. Client endorsed safety plan to be followed to prevent safety issues.   Assessment and Plan: Clinician recommends that Client remain in IOP treatment to better manage mental health symptoms, stabilization and to address treatment plan goals. Clinician recommends adherence to crisis/safety plan, taking medications as prescribed, and following up with medical professionals if any issues arise.   Follow Up Instructions: Clinician will send Webex link for next session. The Client was advised to call back or seek an in-person evaluation if the symptoms worsen or if the condition fails to improve as anticipated.     I provided 180 minutes of non-face-to-face time during this encounter.     Nicole Auer, LCSW

## 2020-09-26 ENCOUNTER — Other Ambulatory Visit (HOSPITAL_COMMUNITY): Payer: No Typology Code available for payment source | Admitting: Licensed Clinical Social Worker

## 2020-09-26 ENCOUNTER — Other Ambulatory Visit: Payer: Self-pay

## 2020-09-26 DIAGNOSIS — F319 Bipolar disorder, unspecified: Secondary | ICD-10-CM

## 2020-09-26 DIAGNOSIS — F314 Bipolar disorder, current episode depressed, severe, without psychotic features: Secondary | ICD-10-CM | POA: Diagnosis not present

## 2020-09-27 ENCOUNTER — Other Ambulatory Visit: Payer: Self-pay

## 2020-09-27 ENCOUNTER — Other Ambulatory Visit (HOSPITAL_COMMUNITY): Payer: No Typology Code available for payment source | Admitting: Licensed Clinical Social Worker

## 2020-09-27 DIAGNOSIS — F314 Bipolar disorder, current episode depressed, severe, without psychotic features: Secondary | ICD-10-CM | POA: Diagnosis not present

## 2020-09-27 DIAGNOSIS — F319 Bipolar disorder, unspecified: Secondary | ICD-10-CM

## 2020-09-28 ENCOUNTER — Other Ambulatory Visit (HOSPITAL_COMMUNITY): Payer: No Typology Code available for payment source | Admitting: Psychiatry

## 2020-09-28 ENCOUNTER — Other Ambulatory Visit: Payer: Self-pay

## 2020-09-28 DIAGNOSIS — F314 Bipolar disorder, current episode depressed, severe, without psychotic features: Secondary | ICD-10-CM | POA: Diagnosis not present

## 2020-09-28 DIAGNOSIS — F319 Bipolar disorder, unspecified: Secondary | ICD-10-CM

## 2020-09-28 NOTE — Patient Instructions (Signed)
D:  Pt completed MH-IOP today.  A:  Discharge.  Follow up with Dr. [name] on 10-18-20 and EAP.  Encouraged support groups.  R:  Patient receptive.
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
## 2020-09-29 ENCOUNTER — Other Ambulatory Visit: Payer: Self-pay

## 2020-09-29 ENCOUNTER — Encounter (HOSPITAL_COMMUNITY): Payer: Self-pay | Admitting: Psychiatry

## 2020-09-29 ENCOUNTER — Encounter (HOSPITAL_COMMUNITY): Payer: Self-pay | Admitting: Family

## 2020-09-29 ENCOUNTER — Other Ambulatory Visit (HOSPITAL_COMMUNITY): Payer: No Typology Code available for payment source | Admitting: Family

## 2020-09-29 MED ORDER — MIRTAZAPINE 7.5 MG PO TABS: 7.5000 mg | ORAL_TABLET | Freq: Every day | ORAL | 0 refills | Status: DC

## 2020-09-29 MED ORDER — ARIPIPRAZOLE 5 MG PO TABS: 5.0000 mg | ORAL_TABLET | Freq: Every day | ORAL | 0 refills | Status: DC

## 2020-09-29 NOTE — Progress Notes (Signed)
Virtual Visit via Video Note  I connected with Nicole [name] on5/19/22 at  9:00 AM EDT by a video enabled telemedicine application and verified that I am speaking with the correct person using two identifiers.   At orientation to the IOP program, Case Manager discussed the limitations of evaluation and management by telemedicine and the availability of in person appointments. The patient expressed understanding and agreed to proceed with virtual visits throughout the duration of the program.   Location:  Patient: Patient Home Provider: Home Office   History of Present Illness: Bipolar 1 DO  Observations/Objective: Check In: Case Manager checked in with all participants to review discharge dates, insurance authorizations, work-related documents and needs from the treatment team regarding medications. Client stated needs and engaged in discussion.   Initial Therapeutic Activity: Counselor facilitated a check-in with group members to assess mood and current functioning. Client shared details of their mental health management since our last session, including challenges and successes. Counselor engaged group in discussion, covering the following topics: life stressors, securing employment and housing strategies, navigating relationships, COVID survival strategies and chronic pain impacting mental health. Client presents with moderate depression and moderate anxiety. Client denied any current SI/HI/psychosis.   Second Therapeutic Activity: Counselor introduced [place], [place] Chaplain to present information and discussion on Grief and Loss. Group members engaged in discussion, sharing how grief impacts them, what comforts them, what emotions are felt, labeling losses, etc. After guest speaker logged off, Counselor prompted group to spend 10-15 minutes journaling to process personal grief and loss situations. Counselor processed entries with group and client's identified areas for  additional processing in individual therapy. Client noted grief related to empty nesting and anticipatory grief.  Check Out: Counselor closed program by allowing time to celebrate the Client as a graduating group member. Counselor shared reflections on progress and allow space for group members to share well wishes and appreciates to the graduating client. Counselor prompted graduating client to share takeaways, reflect on progress and final thoughts for the group. Client shared that coping strategies, group process and support was most helpful. Counselor prompted group members to share what self-care practice or productivity activity they will engage in today. Group members shared their plans with the group. Client endorsed safety plan to be followed to prevent safety issues.   Assessment and Plan: Clinician recommends that Client step down to individual therapy and medication management. Clinician recommends adherence to crisis/safety plan, taking medications as prescribed, and following up with medical professionals if any issues arise.    Follow Up Instructions: Clinician will send Webex link for next session. The Client was advised to call back or seek an in-person evaluation if the symptoms worsen or if the condition fails to improve as anticipated.     I provided 180 minutes of non-face-to-face time during this encounter.     Nicole Auer, LCSW

## 2020-09-29 NOTE — Progress Notes (Unsigned)
Virtual Visit via Telephone Note  I connected with Gurbani Linton-McLeod on 09/29/20 at  9:00 AM EDT by telephone and verified that I am speaking with the correct person using two identifiers.  Location: Patient: Home Provider: Office   I discussed the limitations, risks, security and privacy concerns of performing an evaluation and management service by telephone and the availability of in person appointments. I also discussed with the patient that there may be a patient responsible charge related to this service. The patient expressed understanding and agreed to proceed.   I discussed the assessment and treatment plan with the patient. The patient was provided an opportunity to ask questions and all were answered. The patient agreed with the plan and demonstrated an understanding of the instructions.   The patient was advised to call back or seek an in-person evaluation if the symptoms worsen or if the condition fails to improve as anticipated.  I provided 15 minutes of non-face-to-face time during this encounter.   Derrill Center, NP   [facility] Intensive Outpatient Program Discharge Summary  Iyanah Demont [id number]  Admission date: 09/18/2020 Discharge date: 09/28/2020  Reason for admission: per admission assessment note: Nicole [name] is a 40 y.o. African American female presents with worsing depression and work related issues.  Reports she was diagnosed with bipolar disorder and  ADHD.  She reports she was recently prescribed Strattera, Depakote and Doxepin.  States she does not feel any effects with this medication combination and she has been on this combination since [place].  Renn reports mood irritability, worthlessness and social isolation. Patient continues to endorse worsening depression and going thoughts that she may be losing her job when she returns from [employer]. Patient  is requesting to start antidepressant to help with racing  thoughts and feeling of sadness. Patient reported she has been on multiple medication which hasn't been helpful. Stated she is not sleeping well.Patient stated she has tried olanzapine, Risperdal and Trazodone. Patient reported family history with bipolar disorder denied miana or hypomania symptom. Caytlin reported previous inpatient when she was 35 and 40 years old. Discussed starting Abilify and will follow-up with reported sleeping issues.   Progress in Program Toward Treatment Goals: Ongoing patient recently completed partial hospitalization program and intensive outpatient programming.  Denying suicidal or homicidal ideations.  Denies auditory or visual hallucinations.  Reports her symptoms have improved slightly.  Currently reporting mild anxiety related to returning back to work.  We will make medication refills available.  Support encouragement reassurance was provided.  Progress (rationale):  Keep follow-up with Dr Ronnald Ramp 10-18-2020 and follow-up with Employee Assistance Program (EAP )   Take all medications as prescribed. Keep all follow-up appointments as scheduled.  Do not consume alcohol or use illegal drugs while on prescription medications. Report any adverse effects from your medications to your primary care provider promptly.  In the event of recurrent symptoms or worsening symptoms, call 911, a crisis hotline, or go to the nearest emergency department for evaluation.    Derrill Center, NP 09/29/2020

## 2020-10-01 NOTE — Psych (Signed)
Virtual Visit via Video Note  I connected with Nicole [name] on 09/05/20 at  9:00 AM EDT by a video enabled telemedicine application and verified that I am speaking with the correct person using two identifiers.  Location: Patient: patient home Provider: clinical home office   I discussed the limitations of evaluation and management by telemedicine and the availability of in person appointments. The patient expressed understanding and agreed to proceed.  I discussed the assessment and treatment plan with the patient. The patient was provided an opportunity to ask questions and all were answered. The patient agreed with the plan and demonstrated an understanding of the instructions.   The patient was advised to call back or seek an in-person evaluation if the symptoms worsen or if the condition fails to improve as anticipated.  Pt was provided 240 minutes of non-face-to-face time during this encounter.   Lorin Glass, LCSW    [facility] PHP THERAPIST PROGRESS NOTE  Nicole [name] [id number]  Session Time: 9:00 - 10:00  Participation Level: Active  Behavioral Response: CasualAlertDepressed  Type of Therapy: Group Therapy  Treatment Goals addressed: Coping  Interventions: CBT, DBT, Supportive and Reframing  Summary: Clinician led check-in regarding current stressors and situation.Clinician utilized active listening and empathetic response and validated patient emotions. Clinician facilitated processing group on pertinent issues.   Therapist Response:Nicole [name] is a 40 y.o. female who presents with depression symptoms. Patient arrived within time allowed and reports that she is feeling "okay." Patient rates hermood at a3.5on a scale of 1-10 with 10 being great. Pt reports she is tired and did not sleep well due to late night rumination. Pt reports she tried to spend quality time with her husband yesterday and it went "okay." Pt able to process. Pt  engaged in discussion.      Session Time: 10:00 - 11:00   Participation Level:Active  Behavioral Response:CasualAlertDepressed  Type of Therapy:GroupTherapy  Treatment Goals addressed: Coping  Interventions:CBT, DBT, Supportive and Reframing  Summary:Cln led discussion on impulsivity. Group discussed struggles with impulsivity. Cln highlighted theme of immediacy. Group built insight around the way immediacy interacts with impulsivity. Cln encouraged pt's to consider mantras and grounding statements to remind themselves there is time to think/feel/act.   Therapist Response:Pt engaged in discussion and is able to make connections and gain insight.     Session Time: 11:00- 12:00  Participation Level:Active  Behavioral Response:CasualAlertDepressed  Type of Therapy: Group Therapy, OT  Treatment Goals addressed: Coping  Interventions:Psychosocial skills training, Supportive  Summary:Occupational Therapy group  Therapist Response:Patient engaged in group. See OT note.      Session Time: 12:00 -1:00  Participation Level:Minimal  Behavioral Response:CasualAlertDepressed  Type of Therapy:Grouptherapy  Treatment Goals addressed: Coping  Interventions:CBT; Solution focused; Supportive; Reframing  Summary:12:00 - 12:50:Cln continued discussion on topic of boundaries. Group reviewed previous aspects of boundaries discussed. Cln utilized handout "Tips for [employer]" and group members discussed how to apply the tips. Cln shaped conversation and cued for healthy boundary characteristics.  12:50 -1:00 Clinician led check-out. Clinician assessed for immediate needs, medication compliance and efficacy, and safety concerns  Therapist Response:12:50 - 1:00:Pt minimally engaged in discussion. 12:50 - 1:00: At check-out, patientrates hermood at [facility] a scale of 1-10 with 10 being great.Pt reports afternoon plans of  "I don't know." Pt demonstrates some progress as evidenced byattempting to apply boundaries discussed.Patient denies SI/HI at the end of group.   Suicidal/Homicidal: Nowithout intent/plan  Plan: Pt will continue in PHP while working to decrease depression  symptoms and increase ability to manage symptoms in a healthy manner.   Diagnosis: Severe episode of recurrent major depressive disorder, without psychotic features ([place]) [F33.2]    1. Severe episode of recurrent major depressive disorder, without psychotic features ([place])   2. Bipolar 1 disorder, depressed ([place])       Lorin Glass, LCSW 10/01/2020

## 2020-10-04 NOTE — Psych (Signed)
Virtual Visit via Video Note  I connected with Nicole [name] on 09/06/20 at  9:00 AM EDT by a video enabled telemedicine application and verified that I am speaking with the correct person using two identifiers.  Location: Patient: patient home Provider: clinical home office   I discussed the limitations of evaluation and management by telemedicine and the availability of in person appointments. The patient expressed understanding and agreed to proceed.  I discussed the assessment and treatment plan with the patient. The patient was provided an opportunity to ask questions and all were answered. The patient agreed with the plan and demonstrated an understanding of the instructions.   The patient was advised to call back or seek an in-person evaluation if the symptoms worsen or if the condition fails to improve as anticipated.  Pt was provided 240 minutes of non-face-to-face time during this encounter.   Lorin Glass, LCSW    [facility] PHP THERAPIST PROGRESS NOTE  Nicole [name] [id number]  Session Time: 9:00 - 10:00  Participation Level: Active  Behavioral Response: CasualAlertDepressed  Type of Therapy: Group Therapy  Treatment Goals addressed: Coping  Interventions: CBT, DBT, Supportive and Reframing  Summary: Clinician led check-in regarding current stressors and situation.Clinician utilized active listening and empathetic response and validated patient emotions. Clinician facilitated processing group on pertinent issues.   Therapist Response:Nicole [name] is a 40 y.o. female who presents with depression symptoms. Patient arrived within time allowed and reports that she is feeling "overwhelmed." Patient rates hermood at a2.5on a scale of 1-10 with 10 being great. Pt reports her son left today for his new military post in [place] and she is sad. Pt is able to share her worries and feelings. Pt states sleeping approximately 2 hours due to worry. Pt  reports struggling with feelings of abandonment. Pt able to process. Pt engaged in discussion.      Session Time: 10:00 - 11:00   Participation Level:Active  Behavioral Response:CasualAlertDepressed  Type of Therapy:GroupTherapy  Treatment Goals addressed: Coping  Interventions:CBT, DBT, Supportive and Reframing  Summary:Cln led discussion on next steps. Group members voiced multiple anxieties regarding next steps in treatment and how that coordinates with next steps in their lives. Cln reviewed the levels of treatment and the step-down plans that can ease anxieties regarding preparations for being without a structured program. Cln utilized motivational interviewing to address barriers.   Therapist Response:Pt engaged in discussion and shares anxieties regarding change and leaving an emotional safe space.     Session Time: 11:00- 12:00  Participation Level:Active  Behavioral Response:CasualAlertDepressed  Type of Therapy:Group therapy  Treatment Goals addressed: Coping  Interventions:Supportive, Reframing  Summary:Spiritual Care group  Therapist Response:Pt engaged in session. See chaplain note      Session Time: 12:00 -1:00  Participation Level:Active  Behavioral Response:CasualAlertDepressed  Type of Therapy:Group therapy  Treatment Goals addressed: Coping  Interventions:Psychosocial skills training, Supportive  Summary:12:00 - 12:50:Occupational Therapy group 12:50 -1:00 Clinician led check-out. Clinician assessed for immediate needs, medication compliance and efficacy, and safety concerns  Therapist Response:12:00 - 12:50:Patient engaged in group. See OT note. 12:50 - 1:00: At check-out, patientrates hermood at [facility] a scale of 1-10 with 10 being great.Pt reports afternoon plans of napping and eating. Pt demonstrates some progress as evidenced byincreased acknowledgment of feelings.Patient  denies SI/HI at the end of group.   Suicidal/Homicidal: Nowithout intent/plan  Plan: Pt will continue in PHP while working to decrease depression symptoms and increase ability to manage symptoms in a healthy manner.   Diagnosis:  Bipolar 1 disorder, depressed ([place]) [F31.9]    1. Bipolar 1 disorder, depressed ([place])       Lorin Glass, LCSW 10/04/2020

## 2020-10-11 NOTE — Psych (Signed)
Virtual Visit via Video Note  I connected with Maevis [name] on 09/11/20 at  9:00 AM EDT by a video enabled telemedicine application and verified that I am speaking with the correct person using two identifiers.  Location: Patient: patient home Provider: clinical home office   I discussed the limitations of evaluation and management by telemedicine and the availability of in person appointments. The patient expressed understanding and agreed to proceed.  I discussed the assessment and treatment plan with the patient. The patient was provided an opportunity to ask questions and all were answered. The patient agreed with the plan and demonstrated an understanding of the instructions.   The patient was advised to call back or seek an in-person evaluation if the symptoms worsen or if the condition fails to improve as anticipated.  Pt was provided 240 minutes of non-face-to-face time during this encounter.   Lorin Glass, LCSW    [facility] PHP THERAPIST PROGRESS NOTE  Magali Bray [id number]  Session Time: 9:00 - 10:00  Participation Level: Active  Behavioral Response: CasualAlertDepressed  Type of Therapy: Group Therapy  Treatment Goals addressed: Coping  Interventions: CBT, DBT, Supportive and Reframing  Summary: Clinician led check-in regarding current stressors and situation.Clinician utilized active listening and empathetic response and validated patient emotions. Clinician facilitated processing group on pertinent issues.   Therapist Response:Nicole [name] is a 40 y.o. female who presents with depression symptoms. Patient arrived within time allowed and reports that she is feeling "okay." Patient rates hermood at a5.5on a scale of 1-10 with 10 being great. Pt reports her weekend has "some issues, but was mostly okay." Pt reports continued struggles with her family and drama. Pt reports emotionally she is drained, however slept and ate better  over the weekend. Pt able to process. Pt engaged in discussion.      Session Time: 10:00 - 11:00   Participation Level:Active  Behavioral Response:CasualAlertDepressed  Type of Therapy:GroupTherapy  Treatment Goals addressed: Coping  Interventions:CBT, DBT, Supportive and Reframing  Summary:Cln led discussion on communicating our struggles with our support team. Cln introduced the concept of giving disclosures to the people close to [place] regarding the way we act in specific situations or the way we process certain stimuli. Group members discussed ways to provide this "road map to our brain" and in what situations this may be helpful to them   Therapist Response:Pt engaged in discussion and is able to determine situations in which providing a disclosure can be helpful and practiced doing so. Pt reports issue with her daughter over the weekend and is able to apply topic.     Session Time: 11:00- 12:00  Participation Level:Active  Behavioral Response:CasualAlertDepressed  Type of Therapy: Group Therapy, OT  Treatment Goals addressed: Coping  Interventions:Psychosocial skills training, Supportive  Summary:Occupational Therapy group  Therapist Response:Patient engaged in group. See OT note.      Session Time: 12:00 -1:00  Participation Level:Active  Behavioral Response:CasualAlertDepressed  Type of Therapy:Grouptherapy  Treatment Goals addressed: Coping  Interventions:CBT; Solution focused; Supportive; Reframing  Summary:12:00 - 12:50:Cln introduced CBT and the way in which it can provide context for addressing stumbling blocks. Group discussed "the problem is not the problem, the problem is how we're thinking about the problem" and tried to change perspective on current struggles.  12:50 -1:00 Clinician led check-out. Clinician assessed for immediate needs, medication compliance and efficacy, and safety  concerns  Therapist Response:12:50 - 1:00:Pt engaged in discussion and is able to attempt reframing using CBT. 12:50 - 1:00: At  check-out, patientrates hermood at a 5on a scale of 1-10 with 10 being great.Pt reports afternoon plans of going to an interview. Pt demonstrates some progress as evidenced byimproved sleep and appetite.Patient denies SI/HI at the end of group.   Suicidal/Homicidal: Nowithout intent/plan  Plan: Pt will continue in PHP while working to decrease depression symptoms and increase ability to manage symptoms in a healthy manner.   Diagnosis: Bipolar 1 disorder, depressed (Nicole [name]) [F31.9]    1. Bipolar 1 disorder, depressed (Nicole [name])   2. Severe episode of recurrent major depressive disorder, without psychotic features (Nicole [name])       Lorin Glass, LCSW 10/11/2020

## 2020-10-11 NOTE — Psych (Signed)
Virtual Visit via Video Note  I connected with Nicole [name] on 09/14/20 at  9:00 AM EDT by a video enabled telemedicine application and verified that I am speaking with the correct person using two identifiers.  Location: Patient: patient home Provider: clinical home office   I discussed the limitations of evaluation and management by telemedicine and the availability of in person appointments. The patient expressed understanding and agreed to proceed.  I discussed the assessment and treatment plan with the patient. The patient was provided an opportunity to ask questions and all were answered. The patient agreed with the plan and demonstrated an understanding of the instructions.   The patient was advised to call back or seek an in-person evaluation if the symptoms worsen or if the condition fails to improve as anticipated.  Pt was provided 240 minutes of non-face-to-face time during this encounter.   Lorin Glass, LCSW    [name] PHP THERAPIST PROGRESS NOTE  Nicole [name] [id number]  Session Time: 9:00 - 10:00  Participation Level: Active  Behavioral Response: CasualAlertDepressed  Type of Therapy: Group Therapy  Treatment Goals addressed: Coping  Interventions: CBT, DBT, Supportive and Reframing  Summary: Clinician led check-in regarding current stressors and situation.Clinician utilized active listening and empathetic response and validated patient emotions. Clinician facilitated processing group on pertinent issues.   Therapist Response:Nicole [name] is a 39 y.o. female who presents with depression symptoms. Patient arrived within time allowed and reports that she is feeling "okay." Patient rates hermood at Nicole [name] a scale of 1-10 with 10 being great. Pt reports she slept really well on a new sleep medication but it was difficult for her to wake up. Pt states she heard from her son yesterday which elevated her mood. Pt continues to struggle  with boundaries with her family. Pt able to process. Pt engaged in discussion.      Session Time: 10:00 - 11:00   Participation Level:Active  Behavioral Response:CasualAlertDepressed  Type of Therapy:GroupTherapy  Treatment Goals addressed: Coping  Interventions:CBT, DBT, Supportive and Reframing  Summary:Cln continued topic of CBT cognitive distortions. Cln utilized handout "cognitive distortions" to discuss common examples of distorted thoughts and group members worked to identify examples in their own life.      Therapist Response:Pt enaged in discussion and reports personalization as problematic for her.      Session Time: 11:00- 12:00  Participation Level:Active  Behavioral Response:CasualAlertDepressed  Type of Therapy: Group Therapy, OT  Treatment Goals addressed: Coping  Interventions:Psychosocial skills training, Supportive  Summary:Occupational Therapy group  Therapist Response:Patient engaged in group. See OT note.      Session Time: 12:00 -1:00  Participation Level:Active  Behavioral Response:CasualAlertDepressed  Type of Therapy:Grouptherapy  Treatment Goals addressed: Coping  Interventions:CBT; Solution focused; Supportive; Reframing  Summary:12:00 - 12:50:Cln continued topic of CBT cognitive distortions and introduced thought challenging as a way to  utilize the "challenge" C in C-C-C. Group utilized Web [employer] questions" as a way to introduce challenges and reframe distorted thinking. Group members worked through pt examples to practice challenging distorted thinking.  12:50 -1:00 Clinician led check-out. Clinician assessed for immediate needs, medication compliance and efficacy, and safety concerns  Therapist Response:12:50 - 1:00: Pt engaged in discussion and demonstrates understanding of challenging distorted thoughts through practice.  12:50 - 1:00: At check-out, patientrates  hermood at a 6.5on a scale of 1-10 with 10 being great.Pt reports afternoon plans of relaxing. Pt demonstrates some progress as evidenced byincreased awareness of ways to keep her peace.Patient denies SI/HI at  the end of group.   Suicidal/Homicidal: Nowithout intent/plan  Plan: Pt will continue in PHP while working to decrease depression symptoms and increase ability to manage symptoms in a healthy manner.   Diagnosis: Bipolar 1 disorder, depressed ([place]) [F31.9]    1. Bipolar 1 disorder, depressed ([place])   2. Severe episode of recurrent major depressive disorder, without psychotic features ([place])       Lorin Glass, LCSW 10/11/2020

## 2020-10-11 NOTE — Psych (Signed)
Virtual Visit via Video Note  I connected with [name] on 09/12/20 at  9:00 AM EDT by a video enabled telemedicine application and verified that I am speaking with the correct person using two identifiers.  Location: Patient: patient home Provider: clinical home office   I discussed the limitations of evaluation and management by telemedicine and the availability of in person appointments. The patient expressed understanding and agreed to proceed.  I discussed the assessment and treatment plan with the patient. The patient was provided an opportunity to ask questions and all were answered. The patient agreed with the plan and demonstrated an understanding of the instructions.   The patient was advised to call back or seek an in-person evaluation if the symptoms worsen or if the condition fails to improve as anticipated.  Pt was provided 240 minutes of non-face-to-face time during this encounter.   Nicole Glass, LCSW    [facility] PHP THERAPIST PROGRESS NOTE  Nicole [name] [id number]  Session Time: 9:00 - 10:00  Participation Level: Active  Behavioral Response: CasualAlertDepressed  Type of Therapy: Group Therapy  Treatment Goals addressed: Coping  Interventions: CBT, DBT, Supportive and Reframing  Summary: Clinician led check-in regarding current stressors and situation.Clinician utilized active listening and empathetic response and validated patient emotions. Clinician facilitated processing group on pertinent issues.   Therapist Response:Nicole [name] is a 40 y.o. female who presents with depression symptoms. Patient arrived within time allowed and reports that she is feeling "really tired." Patient rates hermood at [facility] a scale of 1-10 with 10 being great. Pt reports she struggled to fall and stay asleep last night. Pt states having a good conversation with a cousin in which pt was able to process some family issues. Pt states her  interview went well and she is feeling optimistic about it. Pt able to process. Pt engaged in discussion.      Session Time: 10:00 - 11:00   Participation Level:Active  Behavioral Response:CasualAlertDepressed  Type of Therapy:GroupTherapy  Treatment Goals addressed: Coping  Interventions:CBT, DBT, Supportive and Reframing  Summary:Cln continued topic of CBT cognitive distortions and utilized handout "Unhealthy Thought Patterns"to review common examples of distorted thought to increase awareness of the distorted thoughts.     Therapist Response:Pt engaged in discussion and is able to make connections from her life to the distorted thoughts.       Session Time: 11:00- 12:00  Participation Level:Active  Behavioral Response:CasualAlertDepressed  Type of Therapy: Group Therapy, OT  Treatment Goals addressed: Coping  Interventions:Psychosocial skills training, Supportive  Summary:Occupational Therapy group  Therapist Response:Patient engaged in group. See OT note.      Session Time: 12:00 -1:00  Participation Level:Active  Behavioral Response:CasualAlertDepressed  Type of Therapy:Grouptherapy  Treatment Goals addressed: Coping  Interventions:CBT; Solution focused; Supportive; Reframing  Summary:12:00 - 12:50:Cln led discussion on CBT thinking error: mind reading. Cln worked with group members to identify examples of mind reading and the consequences that can come. Group shared ways in which mind reading has been an inssue for them and barriers to working on it.  12:50 -1:00 Clinician led check-out. Clinician assessed for immediate needs, medication compliance and efficacy, and safety concerns  Therapist Response:12:50 - 1:00: Pt engaged in discussion and reports understanding of mind reading.  12:50 - 1:00: At check-out, patientrates hermood at a 6.5on a scale of 1-10 with 10 being great.Pt reports afternoon  plans of walking her dogs. Pt demonstrates some progress as evidenced byincreased positive actions.Patient denies SI/HI at the end of group.  Suicidal/Homicidal: Nowithout intent/plan  Plan: Pt will continue in PHP while working to decrease depression symptoms and increase ability to manage symptoms in a healthy manner.   Diagnosis: Bipolar 1 disorder, depressed ([place]) [F31.9]    1. Bipolar 1 disorder, depressed ([place])   2. Severe episode of recurrent major depressive disorder, without psychotic features ([place])       Nicole Glass, LCSW 10/11/2020

## 2020-10-11 NOTE — Psych (Signed)
Virtual Visit via Video Note  I connected with Nicole [name] on 09/07/20 at  9:00 AM EDT by a video enabled telemedicine application and verified that I am speaking with the correct person using two identifiers.  Location: Patient: patient home Provider: clinical home office   I discussed the limitations of evaluation and management by telemedicine and the availability of in person appointments. The patient expressed understanding and agreed to proceed.  I discussed the assessment and treatment plan with the patient. The patient was provided an opportunity to ask questions and all were answered. The patient agreed with the plan and demonstrated an understanding of the instructions.   The patient was advised to call back or seek an in-person evaluation if the symptoms worsen or if the condition fails to improve as anticipated.  Pt was provided 240 minutes of non-face-to-face time during this encounter.   Lorin Glass, LCSW    [facility] PHP THERAPIST PROGRESS NOTE  Nicole [name] [id number]  Session Time: 9:00 - 10:00  Participation Level: Active  Behavioral Response: CasualAlertDepressed  Type of Therapy: Group Therapy  Treatment Goals addressed: Coping  Interventions: CBT, DBT, Supportive and Reframing  Summary: Clinician led check-in regarding current stressors and situation.Clinician utilized active listening and empathetic response and validated patient emotions. Clinician facilitated processing group on pertinent issues.   Therapist Response:Nicole [name] is a 40 y.o. female who presents with depression symptoms. Patient arrived within time allowed and reports that she is feeling "anxious." Patient rates hermood at a3.5on a scale of 1-10 with 10 being great. Pt reports her mind is racing and she has had difficulty shutting it off. Pt reports still grieving her son's departure and the abandonment issues it stirred up. Pt reports financial  stress. Pt states she began a new prescription tomorrow and it helped her sleep. Pt able to process. Pt engaged in discussion.      Session Time: 10:00 - 11:00   Participation Level:Active  Behavioral Response:CasualAlertDepressed  Type of Therapy:GroupTherapy  Treatment Goals addressed: Coping  Interventions:CBT, DBT, Supportive and Reframing  Summary:Cln led discussion on loss of appetite and the importance of nutrition. Cln discussed the role nutrition can play in emotion regulation. Group members discussed the barriers they have to eating when they are symptomatic and worked to brainstorm ways to work around them.   Therapist Response:Pt states struggling with the motivation to prepare food to eat and is able to determine steps to manage.     Session Time: 11:00- 12:00  Participation Level:Active  Behavioral Response:CasualAlertDepressed  Type of Therapy: Group Therapy, OT  Treatment Goals addressed: Coping  Interventions:Psychosocial skills training, Supportive  Summary:Occupational Therapy group  Therapist Response:Patient engaged in group. See OT note.      Session Time: 12:00 -1:00  Participation Level:Active  Behavioral Response:CasualAlertDepressed  Type of Therapy:Grouptherapy  Treatment Goals addressed: Coping  Interventions:CBT; Solution focused; Supportive; Reframing  Summary:12:00 - 12:50:Cln continued topic of DBT distress tolerance skills and the ACCEPTS distraction skill. Group reviewed P-T-S skills and discussed how they can practice them in their every day life.  12:50 -1:00 Clinician led check-out. Clinician assessed for immediate needs, medication compliance and efficacy, and safety concerns  Therapist Response:12:50 - 1:00:Pt engaged in discussion and reports she is most likely to practice the "T" skill.  12:50 - 1:00: At check-out, patientrates hermood at a3.5on a scale of 1-10  with 10 being great.Pt reports afternoon plans of cleaning. Pt demonstrates some progress as evidenced byincreased participation.Patient denies SI/HI at the end of  group.   Suicidal/Homicidal: Nowithout intent/plan  Plan: Pt will continue in PHP while working to decrease depression symptoms and increase ability to manage symptoms in a healthy manner.   Diagnosis: Bipolar 1 disorder, depressed ([place]) [F31.9]    1. Bipolar 1 disorder, depressed ([place])       Lorin Glass, LCSW 10/11/2020

## 2020-10-11 NOTE — Psych (Signed)
Virtual Visit via Video Note  I connected with Nicole [name] on 09/08/20 at  9:00 AM EDT by a video enabled telemedicine application and verified that I am speaking with the correct person using two identifiers.  Location: Patient: patient home Provider: clinical home office   I discussed the limitations of evaluation and management by telemedicine and the availability of in person appointments. The patient expressed understanding and agreed to proceed.  I discussed the assessment and treatment plan with the patient. The patient was provided an opportunity to ask questions and all were answered. The patient agreed with the plan and demonstrated an understanding of the instructions.   The patient was advised to call back or seek an in-person evaluation if the symptoms worsen or if the condition fails to improve as anticipated.  Pt was provided 240 minutes of non-face-to-face time during this encounter.   Lorin Glass, LCSW    [facility] PHP THERAPIST PROGRESS NOTE  Nicole [name] [id number]  Session Time: 9:00 - 10:00  Participation Level: Active  Behavioral Response: CasualAlertDepressed  Type of Therapy: Group Therapy  Treatment Goals addressed: Coping  Interventions: CBT, DBT, Supportive and Reframing  Summary: Clinician led check-in regarding current stressors and situation.Clinician utilized active listening and empathetic response and validated patient emotions. Clinician facilitated processing group on pertinent issues.   Therapist Response:Nicole [name] is a 40 y.o. female who presents with depression symptoms. Patient arrived within time allowed and reports that she is feeling "tired." Patient rates hermood at a3.5on a scale of 1-10 with 10 being great. Pt reports she did not sleep well due to racing thoughts. Pt reports the day went poorly because of family drama and she struggled to take it in. Pt reports struggling with boundaries. Pt  able to process. Pt engaged in discussion.      Session Time: 10:00 - 11:00   Participation Level:Active  Behavioral Response:CasualAlertDepressed  Type of Therapy:GroupTherapy  Treatment Goals addressed: Coping  Interventions:CBT, DBT, Supportive and Reframing  Summary:Cln led discussion on accountability and the balance between taking responsibility and not beating ourselves up. Group members shared current consequences they are dealing with and how they are processing them. Cln encouraged pt's to utilize the Best Friend Test to make it easier to offer themselves kindness.    Therapist Response:Pt engaged in discussion and is able to process.     Session Time: 11:00- 12:00  Participation Level:Active  Behavioral Response:CasualAlertDepressed  Type of Therapy: Group Therapy, OT  Treatment Goals addressed: Coping  Interventions:Psychosocial skills training, Supportive  Summary:Occupational Therapy group  Therapist Response:Patient engaged in group. See OT note.      Session Time: 12:00 -1:00  Participation Level:Active  Behavioral Response:CasualAlertDepressed  Type of Therapy:Grouptherapy  Treatment Goals addressed: Coping  Interventions:CBT; Solution focused; Supportive; Reframing  Summary:12:00 - 12:50:Cln introduced topic of positive psychology. Group discussed the 5 ways to train your brain to scan for the positive: conscious acts of kindness, meditation, exercise, postive event journaling, and gratitudes. Group members discussed how to apply the principles in their every day life.  12:50 -1:00 Clinician led check-out. Clinician assessed for immediate needs, medication compliance and efficacy, and safety concerns  Therapist Response:12:50 - 1:00:Pt engaged in discussion and reports she can start by utilizing gratitudes.  12:50 - 1:00: At check-out, patientrates hermood at a4.5on a scale of 1-10  with 10 being great.Pt reports afternoon plans of running errands. Pt demonstrates some progress as evidenced byincreased sense of self.Patient denies SI/HI at the end of group.  Suicidal/Homicidal: Nowithout intent/plan  Plan: Pt will continue in PHP while working to decrease depression symptoms and increase ability to manage symptoms in a healthy manner.   Diagnosis: Severe episode of recurrent major depressive disorder, without psychotic features ([place]) [F33.2]    1. Severe episode of recurrent major depressive disorder, without psychotic features ([place])       Lorin Glass, LCSW 10/11/2020

## 2020-10-21 NOTE — Progress Notes (Signed)
Virtual Visit via Video Note  I connected with Nicole [name] on 09/26/20 at  9:00 AM EDT by a video enabled telemedicine application and verified that I am speaking with the correct person using two identifiers.  At orientation to the IOP program, Case Manager discussed the limitations of evaluation and management by telemedicine and the availability of in person appointments. The patient expressed understanding and agreed to proceed with virtual visits throughout the duration of the program.  Location: Patient: patient home Provider: clinical home office    History of Present Illness: Bipolar 1 Disorder   Observations/Objective: 9:00 - 10:00: Clinician led check-in regarding current stressors and situation. Clinician utilized active listening and empathetic response and validated patient emotions. Clinician facilitated processing group on pertinent issues. Patient arrived within time allowed and reports that she is feeling "okay." Patient rates her mood at a 6.5 on a scale of 1-10 with 10 being great. Pt able to process. Pt engaged in discussion.     10:00 - 11:00: Cln led discussion on deep breathing and its therapeutic benefits, using DBT TIPP skills to inform discussion. Group practiced how to breathe from their diaphragms to ensure therapuetic quality and different ways to keep track of regulating breaths.      11:00 - 12:00: Cln continued topic of CBT cognitive distortions and introduced thought challenging. Group utilized Web designer questions" as a way to introduce challenges and reframe distorted thinking. Group members worked through pt examples to practice challenging distorted thinking.     Check Out: Counselor prompted group members to share what self-care practice or productivity activity they will engage today. Client endorsed safety plan to be followed to prevent safety issues.  Assessment and Plan: Clinician recommends that Client remain in IOP treatment to  better manage mental health symptoms, stabilization and to address treatment plan goals. Clinician recommends adherence to crisis/safety plan, taking medications as prescribed, and following up with medical professionals if any issues arise.  Follow Up Instructions: Clinician will send Webex link for next session. The Client was advised to call back or seek an in-person evaluation if the symptoms worsen or if the condition fails to improve as anticipated.    I provided 180 minutes of non-face-to-face time during this encounter.   Lorin Glass, LCSW

## 2020-10-21 NOTE — Progress Notes (Signed)
Virtual Visit via Video Note  I connected with Nicole [name] on 09/27/20 at  9:00 AM EDT by a video enabled telemedicine application and verified that I am speaking with the correct person using two identifiers.  At orientation to the IOP program, Case Manager discussed the limitations of evaluation and management by telemedicine and the availability of in person appointments. The patient expressed understanding and agreed to proceed with virtual visits throughout the duration of the program.  Location: Patient: patient home Provider: clinical home office    History of Present Illness: Bipolar 1 Disorder   Observations/Objective: 9:00 - 10:00: Pharmacy group. Pt engaged in discussion.    10:00 - 11:00: Clinician led check-in regarding current stressors and situation. Clinician utilized active listening and empathetic response and validated patient emotions. Clinician facilitated processing group on pertinent issues. Patient arrived within time allowed and reports that she is feeling "irritable." Patient rates her mood at a 3 on a scale of 1-10 with 10 being great. Pt able to process. Pt engaged in discussion.     11:00 - 12:00: Wellness group with J. Athas on ways overall wellness can support mental health Pt engaged in discussion.      Check Out: Counselor prompted group members to share what self-care practice or productivity activity they will engage today. Client endorsed safety plan to be followed to prevent safety issues.  Assessment and Plan: Clinician recommends that Client remain in IOP treatment to better manage mental health symptoms, stabilization and to address treatment plan goals. Clinician recommends adherence to crisis/safety plan, taking medications as prescribed, and following up with medical professionals if any issues arise.  Follow Up Instructions: Clinician will send Webex link for next session. The Client was advised to call back or seek an in-person  evaluation if the symptoms worsen or if the condition fails to improve as anticipated.    I provided 180 minutes of non-face-to-face time during this encounter.   Lorin Glass, LCSW

## 2020-11-07 ENCOUNTER — Other Ambulatory Visit: Payer: Self-pay

## 2020-11-07 ENCOUNTER — Encounter (HOSPITAL_COMMUNITY): Payer: Self-pay | Admitting: Emergency Medicine

## 2020-11-07 ENCOUNTER — Ambulatory Visit (HOSPITAL_COMMUNITY)
Admission: EM | Admit: 2020-11-07 | Discharge: 2020-11-07 | Disposition: A | Discharge status: Home / Self Care | Payer: No Typology Code available for payment source | Attending: Family Medicine | Admitting: Family Medicine

## 2020-11-07 DIAGNOSIS — J069 Acute upper respiratory infection, unspecified: Secondary | ICD-10-CM

## 2020-11-07 DIAGNOSIS — R0602 Shortness of breath: Secondary | ICD-10-CM

## 2020-11-07 MED ORDER — ALBUTEROL SULFATE HFA 108 (90 BASE) MCG/ACT IN AERS: 1.0000 | INHALATION_SPRAY | Freq: Four times a day (QID) | RESPIRATORY_TRACT | 0 refills | Status: DC | PRN

## 2020-11-07 MED ORDER — PROMETHAZINE-DM 6.25-15 MG/5ML PO SYRP: 5.0000 mL | ORAL_SOLUTION | Freq: Four times a day (QID) | ORAL | 0 refills | Status: DC | PRN

## 2020-11-07 NOTE — ED Triage Notes (Signed)
PT reports fatigue, SOB, cough (productive) for 2 days. Home COVID test was negative yesterday.

## 2020-11-07 NOTE — ED Provider Notes (Signed)
[place]    CSN: [id number] Arrival date & time: 11/07/20  1754      History   Chief Complaint Chief Complaint  Patient presents with   Cough   Shortness of Breath   Fatigue    HPI Nicole [name] is a 40 y.o. female.   Patient presenting today with fatigue, shortness of breath, fever, chills, body aches, cough, congestion, sore throat for 2 days.  Denies chest pain, wheezing, abdominal pain, nausea vomiting or diarrhea.  Drinking hot teas with minimal relief.  No known history of seasonal allergies, asthma that she is aware of.  No new sick contacts.  Home COVID test was negative yesterday.   Past Medical History:  Diagnosis Date   Allergy    Anxiety    Bipolar disorder ([place])    Connective tissue disorder ([place]) 2017   Depression    Eczema    Heart murmur    as a child   Hyperlipidemia    during 2 nd pregnancy   PTSD (post-traumatic stress disorder)    Scarlet fever    as a child    Patient Active Problem List   Diagnosis Date Noted   Loss of weight 08/04/2019   Nausea without vomiting 08/04/2019   Diarrhea 08/04/2019   Encounter for other preprocedural examination 08/04/2019   Family history of breast cancer 10/29/2018   Post concussion syndrome 07/18/2014   Visual disturbances 07/18/2014   Memory changes 07/18/2014   New onset headache 07/18/2014   Fatigue 07/18/2014    Past Surgical History:  Procedure Laterality Date   CHOLECYSTECTOMY  2013   SHOULDER SURGERY  2019   bone spur   WISDOM TOOTH EXTRACTION  2013, 2016    OB History   No obstetric history on file.      Home Medications    Prior to Admission medications   Medication Sig Start Date End Date Taking? Authorizing Provider  albuterol (VENTOLIN HFA) 108 (90 Base) MCG/ACT inhaler Inhale 1-2 puffs into the lungs every 6 (six) hours as needed for wheezing or shortness of breath. 11/07/20  Yes Volney American, PA-C  ARIPiprazole (ABILIFY) 5 MG tablet Take 1  tablet (5 mg total) by mouth daily. 09/29/20 09/29/21 Yes Derrill Center, NP  atomoxetine (STRATTERA) 40 MG capsule Take 40 mg by mouth daily.   Yes [provider]  divalproex (DEPAKOTE ER) 500 MG 24 hr tablet Take 1,000 mg by mouth daily.   Yes [provider]  promethazine-dextromethorphan (PROMETHAZINE-DM) 6.25-15 MG/5ML syrup Take 5 mLs by mouth 4 (four) times daily as needed for cough. 11/07/20  Yes Volney American, PA-C  diclofenac (VOLTAREN) 75 MG EC tablet Take 1 tablet (75 mg total) by mouth 2 (two) times daily. 03/03/20   Posey Boyer, MD  hydroxychloroquine (PLAQUENIL) 200 MG tablet Take 200 mg by mouth 2 (two) times daily. 08/20/19   [provider]  methocarbamol (ROBAXIN) 500 MG tablet Take 1 tablet (500 mg total) by mouth 3 (three) times daily. 03/03/20   Posey Boyer, MD  mirtazapine (REMERON) 7.5 MG tablet Take 1 tablet (7.5 mg total) by mouth at bedtime. 09/29/20   Derrill Center, NP    Family History Family History  Problem Relation Age of Onset   Lupus Mother    High Cholesterol Mother    Diabetes Father    Healthy Daughter    Healthy Son    Bipolar disorder Maternal Uncle    Bipolar disorder Maternal Grandmother  Schizophrenia Maternal Grandmother    Bipolar disorder Cousin    Colon cancer Neg Hx    Esophageal cancer Neg Hx    Rectal cancer Neg Hx    Stomach cancer Neg Hx     Social History Social History   Tobacco Use   Smoking status: Some Days    Packs/day: 0.25    Years: 10.00    Pack years: 2.50    Types: Cigarettes   Smokeless tobacco: Never  Vaping Use   Vaping Use: Never used  Substance Use Topics   Alcohol use: Yes    Alcohol/week: 0.0 standard drinks    Comment: occasional    Drug use: No     Allergies   Latex, Mangifera indica, Mango flavor, and Strawberry extract   Review of Systems Review of Systems Per HPI  Physical Exam Triage Vital Signs ED Triage Vitals  Enc Vitals Group     BP  11/07/20 1903 111/79     Pulse Rate 11/07/20 1903 (!) 102     Resp 11/07/20 1903 16     Temp 11/07/20 1903 100.2 F (37.9 C)     Temp Source 11/07/20 1903 Oral     SpO2 11/07/20 1903 95 %     Weight --      Height --      Head Circumference --      Peak Flow --      Pain Score 11/07/20 1900 4     Pain Loc --      Pain Edu? --      Excl. in [place]? --    No data found.  Updated Vital Signs BP 111/79   Pulse (!) 102   Temp 100.2 F (37.9 C) (Oral)   Resp 16   LMP 10/11/2020   SpO2 95%   Visual Acuity Right Eye Distance:   Left Eye Distance:   Bilateral Distance:    Right Eye Near:   Left Eye Near:    Bilateral Near:     Physical Exam Vitals and nursing note reviewed.  Constitutional:      Appearance: Normal appearance. She is not ill-appearing.  HENT:     Head: Atraumatic.     Right Ear: Tympanic membrane normal.     Left Ear: Tympanic membrane normal.     Nose: Rhinorrhea present.     Mouth/Throat:     Mouth: Mucous membranes are moist.     Pharynx: Posterior oropharyngeal erythema present. No oropharyngeal exudate.  Eyes:     Extraocular Movements: Extraocular movements intact.     Conjunctiva/sclera: Conjunctivae normal.  Cardiovascular:     Rate and Rhythm: Normal rate and regular rhythm.     Heart sounds: Normal heart sounds.  Pulmonary:     Effort: Pulmonary effort is normal. No respiratory distress.     Breath sounds: Normal breath sounds. No wheezing or rales.  Abdominal:     General: Bowel sounds are normal. There is no distension.     Palpations: Abdomen is soft.     Tenderness: There is no abdominal tenderness. There is no guarding.  Musculoskeletal:        General: Normal range of motion.     Cervical back: Normal range of motion and neck supple.  Skin:    General: Skin is warm and dry.  Neurological:     Mental Status: She is alert and oriented to person, place, and time.  Psychiatric:        Mood and Affect:  Mood normal.        Thought  Content: Thought content normal.        Judgment: Judgment normal.     UC Treatments / Results  Labs (all labs ordered are listed, but only abnormal results are displayed) Labs Reviewed - No data to display  EKG   Radiology No results found.  Procedures Procedures (including critical care time)  Medications Ordered in UC Medications - No data to display  Initial Impression / Assessment and Plan / UC Course  I have reviewed the triage vital signs and the nursing notes.  Pertinent labs & imaging results that were available during my care of the patient were reviewed by me and considered in my medical decision making (see chart for details).     Febrile and mildly tachycardic in triage today but otherwise vital signs reassuring.  Exam very reassuring today with no concerning findings.  Clines COVID retesting today given negative home COVID test yesterday, discussed since very early on in symptoms when tested yesterday to repeat the test in 1 or 2 days at home and to quarantine as if COVID-positive just in case.  Work note given.  Albuterol inhaler, Phenergan DM given for symptomatic management and discussed over-the-counter medications and supportive home care.  Return for acutely worsening symptoms.   Final Clinical Impressions(s) / UC Diagnoses   Final diagnoses:  Viral URI with cough  Shortness of breath   Discharge Instructions   None    ED Prescriptions     Medication Sig Dispense Auth. Provider   albuterol (VENTOLIN HFA) 108 (90 Base) MCG/ACT inhaler Inhale 1-2 puffs into the lungs every 6 (six) hours as needed for wheezing or shortness of breath. [place], [place]   promethazine-dextromethorphan (PROMETHAZINE-DM) 6.25-15 MG/5ML syrup Take 5 mLs by mouth 4 (four) times daily as needed for cough. 100 mL Volney American, [place]      PDMP not reviewed this encounter.   Volney American, [place] 11/07/20 1944

## 2021-06-08 NOTE — Progress Notes (Signed)
Subjective:    Nicole [name] - 41 y.o. female MRN [id number]  Date of birth: Feb 27, 1981  HPI  Nicole [name] is to establish care.   Current issues and/or concerns: None   ROS per HPI   Health Maintenance:  Health Maintenance Due  Topic Date Due   COVID-19 Vaccine (1) Never done   HIV Screening  Never done   Hepatitis C Screening  Never done   PAP SMEAR-Modifier  Never done     Past Medical History: Patient Active Problem List   Diagnosis Date Noted   Loss of weight 08/04/2019   Nausea without vomiting 08/04/2019   Diarrhea 08/04/2019   Encounter for other preprocedural examination 08/04/2019   Family history of breast cancer 10/29/2018   Disorder of connective tissue ([place]) 09/11/2016   Post concussion syndrome 07/18/2014   Visual disturbances 07/18/2014   Memory changes 07/18/2014   New onset headache 07/18/2014   Fatigue 07/18/2014    Social History   reports that she has been smoking cigarettes. She has a 2.50 pack-year smoking history. She has never used smokeless tobacco. She reports current alcohol use. She reports that she does not use drugs.   Family History  family history includes Bipolar disorder in her cousin, maternal grandmother, and maternal uncle; Diabetes in her father; Healthy in her daughter and son; High Cholesterol in her mother; Lupus in her mother; Schizophrenia in her maternal grandmother.   Medications: reviewed and updated   Objective:   Physical Exam BP 115/77 (BP Location: Left Arm, Patient Position: Sitting, Cuff Size: Normal)    Pulse 75    Temp 98.5 F (36.9 C)    Resp 18    Ht 5' 6.93" (1.7 m)    Wt 228 lb (103.4 kg)    SpO2 98%    BMI 35.79 kg/m   Physical Exam HENT:     Head: Normocephalic and atraumatic.  Eyes:     Extraocular Movements: Extraocular movements intact.     Conjunctiva/sclera: Conjunctivae normal.     Pupils: Pupils are equal, round, and reactive to light.  Cardiovascular:     Rate and  Rhythm: Normal rate and regular rhythm.     Pulses: Normal pulses.     Heart sounds: Normal heart sounds.  Pulmonary:     Effort: Pulmonary effort is normal.     Breath sounds: Normal breath sounds.  Musculoskeletal:     Cervical back: Normal range of motion and neck supple.  Neurological:     General: No focal deficit present.     Mental Status: She is alert and oriented to person, place, and time.  Psychiatric:        Mood and Affect: Mood normal.        Behavior: Behavior normal.       Assessment & Plan:  1. Encounter to establish care: - Patient presents today to establish care.  - Return for annual physical examination, labs, and health maintenance. Arrive fasting meaning having no food for at least 8 hours prior to appointment. You may have only water or black coffee. Please take scheduled medications as normal.   Patient was given clear instructions to go to Emergency Department or return to medical center if symptoms don't improve, worsen, or new problems develop.The patient verbalized understanding.  I discussed the assessment and treatment plan with the patient. The patient was provided an opportunity to ask questions and all were answered. The patient agreed with the plan and demonstrated an understanding of the  instructions.   The patient was advised to call back or seek an in-person evaluation if the symptoms worsen or if the condition fails to improve as anticipated.    Durene Fruits, NP 06/16/2021, 7:47 PM Primary Care at [facility]

## 2021-06-15 ENCOUNTER — Ambulatory Visit (INDEPENDENT_AMBULATORY_CARE_PROVIDER_SITE_OTHER): Payer: No Typology Code available for payment source | Admitting: Family

## 2021-06-15 ENCOUNTER — Encounter: Payer: Self-pay | Admitting: Family

## 2021-06-15 ENCOUNTER — Other Ambulatory Visit: Payer: Self-pay

## 2021-06-15 VITALS — BP 115/77 | HR 75 | Temp 98.5°F | Resp 18 | Ht 66.93 in | Wt 228.0 lb

## 2021-06-15 DIAGNOSIS — Z7689 Persons encountering health services in other specified circumstances: Secondary | ICD-10-CM | POA: Diagnosis not present

## 2021-06-15 NOTE — Patient Instructions (Addendum)
Thank you for choosing Primary Care at [facility] for your medical home!    Nicole [name] was seen by Camillia Herter, NP today.   Nicole [name]'s primary care provider is Durene Fruits, NP.   For the best care possible,  you should try to see Durene Fruits, NP whenever you come to clinic.   We look forward to seeing you again soon!  If you have any questions about your visit today,  please call us at [phone]  Or feel free to reach your provider via [place].    Keeping you healthy   Get these tests Blood pressure- Have your blood pressure checked once a year by your healthcare provider.  Normal blood pressure is 120/80. Weight- Have your body mass index (BMI) calculated to screen for obesity.  BMI is a measure of body fat based on height and weight. You can also calculate your own BMI at [employer].it. Cholesterol- Have your cholesterol checked regularly starting at age 23, sooner may be necessary if you have diabetes, high blood pressure, if a family member developed heart diseases at an early age or if you smoke.  Chlamydia, HIV, and other sexual transmitted disease- Get screened each year until the age of 77 then within three months of each new sexual partner. Diabetes- Have your blood sugar checked regularly if you have high blood pressure, high cholesterol, a family history of diabetes or if you are overweight.   Get these vaccines Flu shot- Every fall. Tetanus shot- Every 10 years. Menactra- Single dose; prevents meningitis.   Take these steps Don't smoke- If you do smoke, ask your healthcare provider about quitting. For tips on how to quit, go to www.smokefree.gov or call 1-800-QUIT-NOW. Be physically active- Exercise 5 days a week for at least 30 minutes.  If you are not already physically active start slow and gradually work up to 30 minutes of moderate physical activity.  Examples of moderate activity include walking briskly, mowing the  yard, dancing, swimming bicycling, etc. Eat a healthy diet- Eat a variety of healthy foods such as fruits, vegetables, low fat milk, low fat cheese, yogurt, lean meats, poultry, fish, beans, tofu, etc.  For more information on healthy eating, go to www.thenutritionsource.org Drink alcohol in moderation- Limit alcohol intake two drinks or less a day.  Never drink and drive. Dentist- Brush and floss teeth twice daily; visit your dentis twice a year. Depression-Your emotional health is as important as your physical health.  If you're feeling down, losing interest in things you normally enjoy please talk with your healthcare provider. Gun Safety- If you keep a gun in your home, keep it unloaded and with the safety lock on.  Bullets should be stored separately. Helmet use- Always wear a helmet when riding a motorcycle, bicycle, rollerblading or skateboarding. Safe sex- If you may be exposed to a sexually transmitted infection, use a condom Seat belts- Seat bels can save your life; always wear one. Smoke/Carbon Monoxide detectors- These detectors need to be installed on the appropriate level of your home.  Replace batteries at least once a year. Skin Cancer- When out in the sun, cover up and use sunscreen SPF 15 or higher. Violence- If anyone is threatening or hurting you, please tell your healthcare provider.

## 2021-06-15 NOTE — Progress Notes (Signed)
Pt presents to establish care 

## 2021-08-05 NOTE — Progress Notes (Signed)
? ? ?Patient ID: Nicole [name], female    DOB: 1980-09-16  MRN: [id number] ? ?CC: Annual Physical Exam ? ?Subjective: ?Nicole [name] is a 41 y.o. female who presents for annual physical exam.  ? ?Her concerns today include:  ?WEIGHT MANAGEMENT: ?Reports heaviest weight current 220 pounds. Lowest weight 160 to 180 pounds. Goal weight 180 pounds. Has not tried medications for weight loss in the past. She is monitoring what she eats and exercising routinely. Would like to begin a weight loss medication. ? ?2. FATIGUE: ?Reports feeling tired no matter how many hours of sleep she gets. ? ?Patient Active Problem List  ? Diagnosis Date Noted  ? Loss of weight 08/04/2019  ? Nausea without vomiting 08/04/2019  ? Diarrhea 08/04/2019  ? Encounter for other preprocedural examination 08/04/2019  ? Family history of breast cancer 10/29/2018  ? Disorder of connective tissue ([place]) 09/11/2016  ? Post concussion syndrome 07/18/2014  ? Visual disturbances 07/18/2014  ? Memory changes 07/18/2014  ? New onset headache 07/18/2014  ? Fatigue 07/18/2014  ?  ? ?Current Outpatient Medications on File Prior to Visit  ?Medication Sig Dispense Refill  ? ARIPiprazole (ABILIFY) 5 MG tablet Take 1 tablet (5 mg total) by mouth daily. 30 tablet 0  ? atomoxetine (STRATTERA) 80 MG capsule Take 80 mg by mouth every morning.    ? divalproex (DEPAKOTE ER) 500 MG 24 hr tablet Take 1,000 mg by mouth daily.    ? meloxicam (MOBIC) 15 MG tablet Take 15 mg by mouth daily.    ? tiZANidine (ZANAFLEX) 4 MG capsule Take 4 mg by mouth 3 (three) times daily as needed.    ? ?No current facility-administered medications on file prior to visit.  ? ? ?Allergies  ?Allergen Reactions  ? Latex Hives and Itching  ? Mangifera Indica Hives  ? Mango Flavor   ? Strawberry Extract Hives  ? ? ?Social History  ? ?Socioeconomic History  ? Marital status: Married  ?  Spouse name: Bayard Males  ? Number of children: 3  ? Years of education: Not on file  ? Highest  education level: Some college, no degree  ?Occupational History  ?  Employer: [employer]  ?Tobacco Use  ? Smoking status: Some Days  ?  Packs/day: 0.25  ?  Years: 10.00  ?  Pack years: 2.50  ?  Types: Cigarettes  ? Smokeless tobacco: Never  ?Vaping Use  ? Vaping Use: Never used  ?Substance and Sexual Activity  ? Alcohol use: Yes  ?  Alcohol/week: 0.0 standard drinks  ?  Comment: occasional   ? Drug use: No  ? Sexual activity: Yes  ?Other Topics Concern  ? Not on file  ?Social History Narrative  ? ** Merged History Encounter **  ?    ? Patient lives at home with her spouse and children. ?Caffeine Use:  daily  ? ?Social Determinants of Health  ? ?Financial Resource Strain: Not on file  ?Food Insecurity: Not on file  ?Transportation Needs: Not on file  ?Physical Activity: Not on file  ?Stress: Not on file  ?Social Connections: Not on file  ?Intimate Partner Violence: Not on file  ? ? ?Family History  ?Problem Relation Age of Onset  ? Lupus Mother   ? High Cholesterol Mother   ? Diabetes Father   ? Healthy Daughter   ? Healthy Son   ? Bipolar disorder Maternal Uncle   ? Bipolar disorder Maternal Grandmother   ? Schizophrenia Maternal Grandmother   ?  Bipolar disorder Cousin   ? Colon cancer Neg Hx   ? Esophageal cancer Neg Hx   ? Rectal cancer Neg Hx   ? Stomach cancer Neg Hx   ? ? ?Past Surgical History:  ?Procedure Laterality Date  ? CHOLECYSTECTOMY  2013  ? SHOULDER SURGERY  2019  ? bone spur  ? WISDOM TOOTH EXTRACTION  2013, 2016  ? ? ?ROS: ?Review of Systems ?Negative except as stated above ? ?PHYSICAL EXAM: ?BP 117/84 (BP Location: Left Arm, Patient Position: Sitting, Cuff Size: Large)   Pulse 94   Temp 98.3 ?F (36.8 ?C)   Resp 18   Ht 5' 6.93" (1.7 m)   Wt 220 lb (99.8 kg)   SpO2 99%   BMI 34.53 kg/m?  ? ?Physical Exam ?HENT:  ?   Head: Normocephalic and atraumatic.  ?   Right Ear: Tympanic membrane, ear canal and external ear normal.  ?   Left Ear: Tympanic membrane, ear canal and external ear normal.  ?    Nose: Nose normal.  ?   Mouth/Throat:  ?   Mouth: Mucous membranes are moist.  ?   Pharynx: Oropharynx is clear.  ?Eyes:  ?   Extraocular Movements: Extraocular movements intact.  ?   Conjunctiva/sclera: Conjunctivae normal.  ?   Pupils: Pupils are equal, round, and reactive to light.  ?Cardiovascular:  ?   Rate and Rhythm: Normal rate and regular rhythm.  ?   Pulses: Normal pulses.  ?   Heart sounds: Normal heart sounds.  ?Pulmonary:  ?   Effort: Pulmonary effort is normal.  ?   Breath sounds: Normal breath sounds.  ?Chest:  ?   Comments: Patient declined. ?Abdominal:  ?   General: Bowel sounds are normal.  ?   Palpations: Abdomen is soft.  ?Genitourinary: ?   Comments: Patient declined. ?Musculoskeletal:     ?   General: Normal range of motion.  ?   Cervical back: Normal range of motion and neck supple.  ?Skin: ?   General: Skin is warm and dry.  ?   Capillary Refill: Capillary refill takes less than 2 seconds.  ?Neurological:  ?   General: No focal deficit present.  ?   Mental Status: She is alert and oriented to person, place, and time.  ?Psychiatric:     ?   Mood and Affect: Mood normal.     ?   Behavior: Behavior normal.  ? ?ASSESSMENT AND PLAN: ?1. Annual physical exam: ?- Counseled on 150 minutes of exercise per week as tolerated, healthy eating (including decreased daily intake of saturated fats, cholesterol, added sugars, sodium), STI prevention, and routine healthcare maintenance. ? ?2. Screening for metabolic disorder: ?- TDS28+JGOT to check kidney function, liver function, and electrolyte balance.  ?- CMP14+EGFR ? ?3. Screening for deficiency anemia: ?- CBC to screen for anemia. ?- CBC ? ?4. Diabetes mellitus screening: ?- Hemoglobin A1c to screen for pre-diabetes/diabetes. ?- Hemoglobin A1c ? ?5. Screening cholesterol level: ?- Lipid panel to screen for high cholesterol.  ?- Lipid panel ? ?6. Thyroid disorder screen: ?- TSH to check thyroid function.  ?- TSH ? ?7. Encounter for screening mammogram for  malignant neoplasm of breast: ?- Referral for breast cancer screening by mammogram.  ?- MM Digital Screening; Future ? ?8. Pap smear for cervical cancer screening: ?9. Routine screening for STI (sexually transmitted infection): ?- Referral to Gynecology for further evaluation and management.  ?- Ambulatory referral to Gynecology ? ?10. Need for hepatitis C  screening test: ?- Administered today in office.  ?- Hepatitis C Antibody ? ?11. Encounter for screening for HIV: ?- HIV antibody to screen for human immunodeficiency virus.  ?- HIV antibody (with reflex) ? ?12. Encounter for weight management: ?- Phentermine as prescribed. Counseled on medication adherence and adverse effects. Patient verbalized understanding.  ?- Counseled patient that Phentermine is not intended for long-term use and that a medication holiday will need to begin after 3 months of taking medication. Counseled weight loss goal of 5% within 12 weeks or recommendation to discontinue regimen. Also, counseled on the importance of weight checks every 4 weeks. Patient verbalized understanding. ?- I did check the [facility] prescription drug database and found no frequent prescribers of opiates or evidence of aberrant behavior. ?- Counseled on low-sodium DASH diet and 150 minutes of moderate intensity exercise per week as tolerated to assist with weight management.  ?- Follow-up with primary provider in 4 weeks or sooner if needed. ?- phentermine 15 MG capsule; Take 1 capsule (15 mg total) by mouth every morning.  Dispense: 30 capsule; Refill: 0 ? ?13. Encounter for vitamin deficiency screening: ?- Screening for deficiency.  ?- Vitamin D, 25-hydroxy ? ? ?Patient was given the opportunity to ask questions.  Patient verbalized understanding of the plan and was able to repeat key elements of the plan. Patient was given clear instructions to go to Emergency Department or return to medical center if symptoms don't improve, worsen, or new problems  develop.The patient verbalized understanding. ? ? ?Orders Placed This Encounter  ?Procedures  ? MM Digital Screening  ? HIV antibody (with reflex)  ? Hepatitis C Antibody  ? CBC  ? Lipid panel  ? TSH  ? CMP14+EGFR  ? H

## 2021-08-10 ENCOUNTER — Ambulatory Visit (INDEPENDENT_AMBULATORY_CARE_PROVIDER_SITE_OTHER): Payer: No Typology Code available for payment source | Admitting: Family

## 2021-08-10 VITALS — BP 117/84 | HR 94 | Temp 98.3°F | Resp 18 | Ht 66.93 in | Wt 220.0 lb

## 2021-08-10 DIAGNOSIS — Z131 Encounter for screening for diabetes mellitus: Secondary | ICD-10-CM

## 2021-08-10 DIAGNOSIS — Z130 Encounter for screening for diseases of the blood and blood-forming organs and certain disorders involving the immune mechanism: Secondary | ICD-10-CM

## 2021-08-10 DIAGNOSIS — Z13220 Encounter for screening for lipoid disorders: Secondary | ICD-10-CM

## 2021-08-10 DIAGNOSIS — Z7689 Persons encountering health services in other specified circumstances: Secondary | ICD-10-CM

## 2021-08-10 DIAGNOSIS — Z0000 Encounter for general adult medical examination without abnormal findings: Secondary | ICD-10-CM

## 2021-08-10 DIAGNOSIS — Z1321 Encounter for screening for nutritional disorder: Secondary | ICD-10-CM

## 2021-08-10 DIAGNOSIS — Z13228 Encounter for screening for other metabolic disorders: Secondary | ICD-10-CM

## 2021-08-10 DIAGNOSIS — Z1231 Encounter for screening mammogram for malignant neoplasm of breast: Secondary | ICD-10-CM

## 2021-08-10 DIAGNOSIS — Z1329 Encounter for screening for other suspected endocrine disorder: Secondary | ICD-10-CM

## 2021-08-10 DIAGNOSIS — Z124 Encounter for screening for malignant neoplasm of cervix: Secondary | ICD-10-CM

## 2021-08-10 DIAGNOSIS — Z1159 Encounter for screening for other viral diseases: Secondary | ICD-10-CM

## 2021-08-10 DIAGNOSIS — Z113 Encounter for screening for infections with a predominantly sexual mode of transmission: Secondary | ICD-10-CM

## 2021-08-10 DIAGNOSIS — Z114 Encounter for screening for human immunodeficiency virus [HIV]: Secondary | ICD-10-CM

## 2021-08-10 MED ORDER — PHENTERMINE HCL 15 MG PO CAPS: 15.0000 mg | ORAL_CAPSULE | ORAL | 0 refills | Status: DC

## 2021-08-10 NOTE — Patient Instructions (Signed)

## 2021-08-10 NOTE — Progress Notes (Signed)
Pt presents for annual physical exam declined pap request to be referred to gyn. ?Pt complains of drowsiness even after good night sleep  ?Pt wants to discuss weight loss options  ?

## 2021-08-11 LAB — CMP14+EGFR
ALT: 11 IU/L (ref 0–32)
AST: 15 IU/L (ref 0–40)
Albumin/Globulin Ratio: 1.6 (ref 1.2–2.2)
Albumin: 4.4 g/dL (ref 3.8–4.8)
Alkaline Phosphatase: 62 IU/L (ref 44–121)
BUN/Creatinine Ratio: 7 — ABNORMAL LOW (ref 9–23)
BUN: 5 mg/dL — ABNORMAL LOW (ref 6–24)
Bilirubin Total: 0.3 mg/dL (ref 0.0–1.2)
CO2: 22 mmol/L (ref 20–29)
Calcium: 8.8 mg/dL (ref 8.7–10.2)
Chloride: 104 mmol/L (ref 96–106)
Creatinine, Ser: 0.69 mg/dL (ref 0.57–1.00)
Globulin, Total: 2.7 g/dL (ref 1.5–4.5)
Glucose: 85 mg/dL (ref 70–99)
Potassium: 4 mmol/L (ref 3.5–5.2)
Sodium: 140 mmol/L (ref 134–144)
Total Protein: 7.1 g/dL (ref 6.0–8.5)
eGFR: 112 mL/min/{1.73_m2} (ref >59)

## 2021-08-11 LAB — LIPID PANEL
Chol/HDL Ratio: 2.2 ratio (ref 0.0–4.4)
Cholesterol, Total: 172 mg/dL (ref 100–199)
HDL: 78 mg/dL (ref >39)
LDL Chol Calc (NIH): 77 mg/dL (ref 0–99)
Triglycerides: 96 mg/dL (ref 0–149)
VLDL Cholesterol Cal: 17 mg/dL (ref 5–40)

## 2021-08-11 LAB — CBC
Hematocrit: 37.9 % (ref 34.0–46.6)
Hemoglobin: 13 g/dL (ref 11.1–15.9)
MCH: 29.4 pg (ref 26.6–33.0)
MCHC: 34.3 g/dL (ref 31.5–35.7)
MCV: 86 fL (ref 79–97)
Platelets: 296 10*3/uL (ref 150–450)
RBC: 4.42 x10E6/uL (ref 3.77–5.28)
RDW: 14.4 % (ref 11.7–15.4)
WBC: 6.6 10*3/uL (ref 3.4–10.8)

## 2021-08-11 LAB — TSH: TSH: 2.03 u[IU]/mL (ref 0.450–4.500)

## 2021-08-11 LAB — HEMOGLOBIN A1C
Est. average glucose Bld gHb Est-mCnc: 123 mg/dL
Hgb A1c MFr Bld: 5.9 % — ABNORMAL HIGH (ref 4.8–5.6)

## 2021-08-11 LAB — HIV ANTIBODY (ROUTINE TESTING W REFLEX): HIV Screen 4th Generation wRfx: NONREACTIVE

## 2021-08-11 LAB — HEPATITIS C ANTIBODY: Hep C Virus Ab: NONREACTIVE

## 2021-08-11 LAB — VITAMIN D 25 HYDROXY (VIT D DEFICIENCY, FRACTURES): Vit D, 25-Hydroxy: 13.4 ng/mL — ABNORMAL LOW (ref 30.0–100.0)

## 2021-08-12 ENCOUNTER — Other Ambulatory Visit: Payer: Self-pay | Admitting: Family

## 2021-08-12 DIAGNOSIS — R7303 Prediabetes: Secondary | ICD-10-CM

## 2021-08-12 DIAGNOSIS — E559 Vitamin D deficiency, unspecified: Secondary | ICD-10-CM | POA: Insufficient documentation

## 2021-08-12 MED ORDER — VITAMIN D (ERGOCALCIFEROL) 1.25 MG (50000 UNIT) PO CAPS: 50000.0000 [IU] | ORAL_CAPSULE | ORAL | 2 refills | Status: DC

## 2021-08-12 NOTE — Progress Notes (Signed)
Kidney function and electrolytes normal.  ? ?Liver function normal.  ? ?Thyroid function normal.  ? ?Cholesterol normal.  ? ?No anemia.  ? ?Hepatitis C negative.  ? ?HIV negative.  ? ?Hemoglobin A1c is consistent with pre-diabetes. Practice healthy eating habits of fresh fruit and vegetables, lean baked meats such as chicken, fish, and [place]; limit breads, rice, pastas, and desserts; practice regular aerobic exercise (at least 150 minutes a week as tolerated). No medication needed as of present. Encouraged to recheck prediabetes in 6 months.  ? ?Vitamin D low. Prescribed weekly Vitamin D supplement. Encouraged to recheck in 12 weeks.

## 2021-08-13 ENCOUNTER — Telehealth: Payer: Self-pay | Admitting: Family

## 2021-08-13 DIAGNOSIS — E559 Vitamin D deficiency, unspecified: Secondary | ICD-10-CM

## 2021-08-13 NOTE — Telephone Encounter (Signed)
Pt aware of lab results 

## 2021-08-13 NOTE — Telephone Encounter (Signed)
Pt wants Rx rerouted to the correct pharmacy (listed below) ? ?Medication Refill - Medication: Vitamin D, Ergocalciferol, (DRISDOL) 1.25 MG (50000 UNIT) CAPS capsule  ? ?Has the patient contacted their pharmacy? Yes.   ?(Agent: If no, request that the patient contact the pharmacy for the refill. If patient does not wish to contact the pharmacy document the reason why and proceed with request.) ?(Agent: If yes, when and what did the pharmacy advise?) ? ?Preferred Pharmacy (with phone number or street name):  ?[facility], [place]  ?[address] [place] [address]  ?Phone: [phone] Fax: [phone]  ? ?Has the patient been seen for an appointment in the last year OR does the patient have an upcoming appointment? Yes.   ? ?Agent: Please be advised that RX refills may take up to 3 business days. We ask that you follow-up with your pharmacy. ? ?

## 2021-08-13 NOTE — Telephone Encounter (Signed)
Copied from [place] [phone]. Topic: General - Other ?>> Aug 13, 2021  1:54 PM Alanda Slim E wrote: ?Reason for CRM: Pt returned Nurse call about lab results/ please advise ?

## 2021-08-14 ENCOUNTER — Other Ambulatory Visit: Payer: Self-pay

## 2021-08-14 DIAGNOSIS — E559 Vitamin D deficiency, unspecified: Secondary | ICD-10-CM

## 2021-08-14 MED ORDER — VITAMIN D (ERGOCALCIFEROL) 1.25 MG (50000 UNIT) PO CAPS: 50000.0000 [IU] | ORAL_CAPSULE | ORAL | 2 refills | Status: DC

## 2021-08-14 NOTE — Telephone Encounter (Signed)
Medication sent into [facility] pharmacy  ?

## 2021-09-10 ENCOUNTER — Telehealth: Payer: Self-pay | Admitting: Family

## 2021-09-10 NOTE — Telephone Encounter (Signed)
Copied from [place]. Topic: General - Other ?>> Sep 10, 2021  2:02 PM Leward Quan A wrote: ?Reason for CRM: Patient called in to inform Ms Minette Brine that she need another Rx for the phentermine 15 MG capsule. Asking for a call when Rx is ready for pick up. Can be reached at Ph# [phone] ?

## 2021-09-12 NOTE — Progress Notes (Signed)
? ? ?Patient ID: Nicole [name], female    DOB: 1980/11/19  MRN: [id number] ? ?CC: Weight Check  ? ?Subjective: ?Nicole [name] is a 41 y.o. female who presents for weight check.  ? ?Her concerns today include:  ?Weight check: ?08/10/2021: ?- Phentermine as prescribed. ? ?09/14/2021: ?Reports consistent with lifestyle changes. Monitoring what she eats and routine exercise even though not presently losing weight.  ? ?Patient Active Problem List  ? Diagnosis Date Noted  ? Prediabetes 08/12/2021  ? Vitamin D deficiency 08/12/2021  ? Loss of weight 08/04/2019  ? Nausea without vomiting 08/04/2019  ? Diarrhea 08/04/2019  ? Encounter for other preprocedural examination 08/04/2019  ? Family history of breast cancer 10/29/2018  ? Disorder of connective tissue ([place]) 09/11/2016  ? Post concussion syndrome 07/18/2014  ? Visual disturbances 07/18/2014  ? Memory changes 07/18/2014  ? New onset headache 07/18/2014  ? Fatigue 07/18/2014  ?  ? ?Current Outpatient Medications on File Prior to Visit  ?Medication Sig Dispense Refill  ? ARIPiprazole (ABILIFY) 5 MG tablet Take 1 tablet (5 mg total) by mouth daily. 30 tablet 0  ? atomoxetine (STRATTERA) 80 MG capsule Take 80 mg by mouth every morning.    ? divalproex (DEPAKOTE ER) 500 MG 24 hr tablet Take 1,000 mg by mouth daily.    ? meloxicam (MOBIC) 15 MG tablet Take 15 mg by mouth daily.    ? tiZANidine (ZANAFLEX) 4 MG capsule Take 4 mg by mouth 3 (three) times daily as needed.    ? Vitamin D, Ergocalciferol, (DRISDOL) 1.25 MG (50000 UNIT) CAPS capsule Take 1 capsule (50,000 Units total) by mouth every 7 (seven) days for 12 doses. 4 capsule 2  ? ?No current facility-administered medications on file prior to visit.  ? ? ?Allergies  ?Allergen Reactions  ? Latex Hives and Itching  ? Mangifera Indica Hives  ? Mango Flavor   ? Strawberry Extract Hives  ? ? ?Social History  ? ?Socioeconomic History  ? Marital status: Married  ?  Spouse name: Nicole [name]  ? Number of children:  3  ? Years of education: Not on file  ? Highest education level: Some college, no degree  ?Occupational History  ?  Employer: [employer]  ?Tobacco Use  ? Smoking status: Some Days  ?  Packs/day: 0.25  ?  Years: 10.00  ?  Pack years: 2.50  ?  Types: Cigarettes  ? Smokeless tobacco: Never  ?Vaping Use  ? Vaping Use: Never used  ?Substance and Sexual Activity  ? Alcohol use: Yes  ?  Alcohol/week: 0.0 standard drinks  ?  Comment: occasional   ? Drug use: No  ? Sexual activity: Yes  ?Other Topics Concern  ? Not on file  ?Social History Narrative  ? ** Merged History Encounter **  ?    ? Patient lives at home with her spouse and children. ?Caffeine Use:  daily  ? ?Social Determinants of Health  ? ?Financial Resource Strain: Not on file  ?Food Insecurity: Not on file  ?Transportation Needs: Not on file  ?Physical Activity: Not on file  ?Stress: Not on file  ?Social Connections: Not on file  ?Intimate Partner Violence: Not on file  ? ? ?Family History  ?Problem Relation Age of Onset  ? Lupus Mother   ? High Cholesterol Mother   ? Diabetes Father   ? Healthy Daughter   ? Healthy Son   ? Bipolar disorder Maternal Uncle   ? Bipolar disorder Maternal Grandmother   ?  Schizophrenia Maternal Grandmother   ? Bipolar disorder Cousin   ? Colon cancer Neg Hx   ? Esophageal cancer Neg Hx   ? Rectal cancer Neg Hx   ? Stomach cancer Neg Hx   ? ? ?Past Surgical History:  ?Procedure Laterality Date  ? CHOLECYSTECTOMY  2013  ? SHOULDER SURGERY  2019  ? bone spur  ? WISDOM TOOTH EXTRACTION  2013, 2016  ? ? ?ROS: ?Review of Systems ?Negative except as stated above ? ?PHYSICAL EXAM: ?BP 117/88 (BP Location: Left Arm, Patient Position: Sitting, Cuff Size: Large)   Pulse (!) 103   Temp 98.3 ?F (36.8 ?C)   Resp 18   Ht 5' 6.93" (1.7 m)   Wt 225 lb (102.1 kg)   SpO2 98%   BMI 35.31 kg/m?  ? ?Physical Exam ?HENT:  ?   Head: Normocephalic and atraumatic.  ?Eyes:  ?   Extraocular Movements: Extraocular movements intact.  ?   Conjunctiva/sclera:  Conjunctivae normal.  ?   Pupils: Pupils are equal, round, and reactive to light.  ?Cardiovascular:  ?   Rate and Rhythm: Tachycardia present.  ?   Pulses: Normal pulses.  ?   Heart sounds: Normal heart sounds.  ?Pulmonary:  ?   Effort: Pulmonary effort is normal.  ?   Breath sounds: Normal breath sounds.  ?Musculoskeletal:  ?   Cervical back: Normal range of motion and neck supple.  ?Neurological:  ?   General: No focal deficit present.  ?   Mental Status: She is alert and oriented to person, place, and time.  ?Psychiatric:     ?   Mood and Affect: Mood normal.     ?   Behavior: Behavior normal.  ? ?ASSESSMENT AND PLAN: ?1. Encounter for weight management: ?- Increase Phentermine from 15 mg daily to 30 mg daily.  ?- Follow-up with primary provider in 4 weeks or sooner if needed.  ?- phentermine 30 MG capsule; Take 1 capsule (30 mg total) by mouth every morning.  Dispense: 30 capsule; Refill: 0 ? ? ?Patient was given the opportunity to ask questions.  Patient verbalized understanding of the plan and was able to repeat key elements of the plan. Patient was given clear instructions to go to Emergency Department or return to medical center if symptoms don't improve, worsen, or new problems develop.The patient verbalized understanding. ? ? ? ?Requested Prescriptions  ? ?Signed Prescriptions Disp Refills  ? phentermine 30 MG capsule 30 capsule 0  ?  Sig: Take 1 capsule (30 mg total) by mouth every morning.  ? ? ?Return in about 4 weeks (around 10/12/2021) for Follow-Up or next available weight check . ? ?Camillia Herter, NP  ?

## 2021-09-14 ENCOUNTER — Ambulatory Visit: Payer: No Typology Code available for payment source | Admitting: Family

## 2021-09-14 ENCOUNTER — Encounter: Payer: Self-pay | Admitting: Family

## 2021-09-14 VITALS — BP 117/88 | HR 103 | Temp 98.3°F | Resp 18 | Ht 66.93 in | Wt 225.0 lb

## 2021-09-14 DIAGNOSIS — Z7689 Persons encountering health services in other specified circumstances: Secondary | ICD-10-CM

## 2021-09-14 MED ORDER — PHENTERMINE HCL 30 MG PO CAPS: 30.0000 mg | ORAL_CAPSULE | ORAL | 0 refills | Status: DC

## 2021-09-14 NOTE — Progress Notes (Signed)
Pt presents for weight check  ?

## 2021-10-05 ENCOUNTER — Ambulatory Visit: Payer: No Typology Code available for payment source | Admitting: Family

## 2021-10-14 NOTE — Progress Notes (Signed)
Patient ID: Nicole [name], female    DOB: [birth year]  MRN: [id number]  CC: Weight Check  Subjective: Nicole [name] is a 41 y.o. female who presents for weight check.   Her concerns today include:  Doing well on current regimen, no issues/concerns. She has been exercising routinely and monitoring what she eats.   Patient Active Problem List   Diagnosis Date Noted   Prediabetes 08/12/2021   Vitamin D deficiency 08/12/2021   Loss of weight 08/04/2019   Nausea without vomiting 08/04/2019   Diarrhea 08/04/2019   Encounter for other preprocedural examination 08/04/2019   Family history of breast cancer 10/29/2018   Disorder of connective tissue ([place]) 09/11/2016   Post concussion syndrome 07/18/2014   Visual disturbances 07/18/2014   Memory changes 07/18/2014   New onset headache 07/18/2014   Fatigue 07/18/2014     Current Outpatient Medications on File Prior to Visit  Medication Sig Dispense Refill   ARIPiprazole (ABILIFY) 5 MG tablet Take 1 tablet (5 mg total) by mouth daily. 30 tablet 0   atomoxetine (STRATTERA) 80 MG capsule Take 80 mg by mouth every morning.     divalproex (DEPAKOTE ER) 500 MG 24 hr tablet Take 1,000 mg by mouth daily.     meloxicam (MOBIC) 15 MG tablet Take 15 mg by mouth daily.     tiZANidine (ZANAFLEX) 4 MG capsule Take 4 mg by mouth 3 (three) times daily as needed.     Vitamin D, Ergocalciferol, (DRISDOL) 1.25 MG (50000 UNIT) CAPS capsule Take 1 capsule (50,000 Units total) by mouth every 7 (seven) days for 12 doses. 4 capsule 2   No current facility-administered medications on file prior to visit.    Allergies  Allergen Reactions   Latex Hives and Itching   Mangifera Indica Hives   Mango Flavor    Strawberry Extract Hives    Social History   Socioeconomic History   Marital status: Married    Spouse name: Bayard Males   Number of children: 3   Years of education: Not on file   Highest education level: Some college, no  degree  Occupational History    Employer: [employer]  Tobacco Use   Smoking status: Some Days    Packs/day: 0.25    Years: 10.00    Total pack years: 2.50    Types: Cigarettes   Smokeless tobacco: Never  Vaping Use   Vaping Use: Never used  Substance and Sexual Activity   Alcohol use: Yes    Alcohol/week: 0.0 standard drinks of alcohol    Comment: occasional    Drug use: No   Sexual activity: Yes  Other Topics Concern   Not on file  Social History Narrative   ** Merged History Encounter **       Patient lives at home with her spouse and children. Caffeine Use:  daily   Social Determinants of Health   Financial Resource Strain: Not on file  Food Insecurity: Not on file  Transportation Needs: Not on file  Physical Activity: Not on file  Stress: Not on file  Social Connections: Not on file  Intimate Partner Violence: Not on file    Family History  Problem Relation Age of Onset   Lupus Mother    High Cholesterol Mother    Diabetes Father    Healthy Daughter    Healthy Son    Bipolar disorder Maternal Uncle    Bipolar disorder Maternal Grandmother    Schizophrenia Maternal Grandmother    Bipolar  disorder Cousin    Colon cancer Neg Hx    Esophageal cancer Neg Hx    Rectal cancer Neg Hx    Stomach cancer Neg Hx     Past Surgical History:  Procedure Laterality Date   CHOLECYSTECTOMY  2013   SHOULDER SURGERY  2019   bone spur   WISDOM TOOTH EXTRACTION  2013, 2016    ROS: Review of Systems Negative except as stated above  PHYSICAL EXAM: BP 114/82 (BP Location: Left Arm, Patient Position: Sitting, Cuff Size: Normal)   Pulse 85   Temp 98.3 F (36.8 C)   Resp 18   Ht 5' 6.93" (1.7 m)   Wt 222 lb (100.7 kg)   SpO2 98%   BMI 34.84 kg/m    Wt Readings from Last 3 Encounters:  10/19/21 222 lb (100.7 kg)  09/14/21 225 lb (102.1 kg)  08/10/21 220 lb (99.8 kg)    Physical Exam HENT:     Head: Normocephalic and atraumatic.  Eyes:     Extraocular  Movements: Extraocular movements intact.     Conjunctiva/sclera: Conjunctivae normal.     Pupils: Pupils are equal, round, and reactive to light.  Cardiovascular:     Rate and Rhythm: Normal rate and regular rhythm.     Pulses: Normal pulses.     Heart sounds: Normal heart sounds.  Pulmonary:     Effort: Pulmonary effort is normal.     Breath sounds: Normal breath sounds.  Musculoskeletal:     Cervical back: Normal range of motion and neck supple.  Neurological:     General: No focal deficit present.     Mental Status: She is alert and oriented to person, place, and time.  Psychiatric:        Mood and Affect: Mood normal.        Behavior: Behavior normal.     ASSESSMENT AND PLAN: 1. Encounter for weight management - Phentermine as prescribed.  - Patient verbalized understanding medication holiday to begin once she has completed today's prescription. Will consider resuming regimen October 2023.  - Follow-up with primary provider as scheduled.  - phentermine 37.5 MG capsule; Take 1 capsule (37.5 mg total) by mouth every morning.  Dispense: 30 capsule; Refill: 0    Patient was given the opportunity to ask questions.  Patient verbalized understanding of the plan and was able to repeat key elements of the plan. Patient was given clear instructions to go to Emergency Department or return to medical center if symptoms don't improve, worsen, or new problems develop.The patient verbalized understanding.   Requested Prescriptions   Signed Prescriptions Disp Refills   phentermine 37.5 MG capsule 30 capsule 0    Sig: Take 1 capsule (37.5 mg total) by mouth every morning.    Follow-up with primary provider as scheduled.  Camillia Herter, NP

## 2021-10-19 ENCOUNTER — Ambulatory Visit (INDEPENDENT_AMBULATORY_CARE_PROVIDER_SITE_OTHER): Payer: No Typology Code available for payment source | Admitting: Family

## 2021-10-19 VITALS — BP 114/82 | HR 85 | Temp 98.3°F | Resp 18 | Ht 66.93 in | Wt 222.0 lb

## 2021-10-19 DIAGNOSIS — Z7689 Persons encountering health services in other specified circumstances: Secondary | ICD-10-CM

## 2021-10-19 MED ORDER — PHENTERMINE HCL 37.5 MG PO CAPS: 37.5000 mg | ORAL_CAPSULE | ORAL | 0 refills | Status: DC

## 2021-10-19 NOTE — Progress Notes (Signed)
Pt presents for weight check

## 2021-10-22 ENCOUNTER — Ambulatory Visit
Admission: EM | Admit: 2021-10-22 | Discharge: 2021-10-22 | Disposition: A | Discharge status: Home / Self Care | Payer: No Typology Code available for payment source | Attending: Internal Medicine | Admitting: Internal Medicine

## 2021-10-22 DIAGNOSIS — S0501XA Injury of conjunctiva and corneal abrasion without foreign body, right eye, initial encounter: Secondary | ICD-10-CM | POA: Diagnosis not present

## 2021-10-22 MED ORDER — OFLOXACIN 0.3 % OP SOLN: OPHTHALMIC | 0 refills | Status: AC

## 2021-10-22 NOTE — ED Triage Notes (Signed)
Patient presents to Urgent Care with complaints of right eye pain and irritation since Saturday. Patient reports she may have scratched it attempting to remove contacts.

## 2021-10-22 NOTE — Discharge Instructions (Signed)
It appears that you have a corneal abrasion which is a scratch of your eye.  This is being treated with antibiotic drops.  Please follow-up with provided contact information for ophthalmology for further evaluation and management today.

## 2021-10-22 NOTE — ED Provider Notes (Signed)
[facility]    CSN: [id number] Arrival date & time: 10/22/21  1013      History   Chief Complaint Chief Complaint  Patient presents with   Eye Pain    HPI Nicole [name] is a 41 y.o. female.   Patient presents with right eye pain and irritation that started approximately 3 days ago.  Patient thinks that she may have scratched her eye while removing her contacts as pain and irritation started subsequently after this.  Denies any other trauma or foreign body to the eye.  Denies any drainage.  Denies any associated upper respiratory symptoms or fever.  Denies any blurry vision.   Eye Pain    Past Medical History:  Diagnosis Date   Allergy    Anxiety    Bipolar disorder ([place])    Connective tissue disorder ([place]) 2017   Depression    Eczema    Heart murmur    as a child   Hyperlipidemia    during 2 nd pregnancy   PTSD (post-traumatic stress disorder)    Scarlet fever    as a child    Patient Active Problem List   Diagnosis Date Noted   Prediabetes 08/12/2021   Vitamin D deficiency 08/12/2021   Loss of weight 08/04/2019   Nausea without vomiting 08/04/2019   Diarrhea 08/04/2019   Encounter for other preprocedural examination 08/04/2019   Family history of breast cancer 10/29/2018   Disorder of connective tissue ([place]) 09/11/2016   Post concussion syndrome 07/18/2014   Visual disturbances 07/18/2014   Memory changes 07/18/2014   New onset headache 07/18/2014   Fatigue 07/18/2014    Past Surgical History:  Procedure Laterality Date   CHOLECYSTECTOMY  2013   SHOULDER SURGERY  2019   bone spur   WISDOM TOOTH EXTRACTION  2013, 2016    OB History   No obstetric history on file.      Home Medications    Prior to Admission medications   Medication Sig Start Date End Date Taking? Authorizing Provider  ofloxacin (OCUFLOX) 0.3 % ophthalmic solution Place 1 drop into the right eye every 4 (four) hours for 2 days, THEN 1 drop 4 (four)  times daily for 5 days. 10/22/21 10/29/21 Yes , Hildred Alamin E, FNP  ARIPiprazole (ABILIFY) 5 MG tablet Take 1 tablet (5 mg total) by mouth daily. 09/29/20 09/29/21  Derrill Center, NP  atomoxetine (STRATTERA) 80 MG capsule Take 80 mg by mouth every morning. 06/10/21   [provider]  divalproex (DEPAKOTE ER) 500 MG 24 hr tablet Take 1,000 mg by mouth daily.    [provider]  meloxicam (MOBIC) 15 MG tablet Take 15 mg by mouth daily. 03/21/21   [provider]  phentermine 37.5 MG capsule Take 1 capsule (37.5 mg total) by mouth every morning. 10/19/21 11/18/21  Camillia Herter, NP  tiZANidine (ZANAFLEX) 4 MG capsule Take 4 mg by mouth 3 (three) times daily as needed. 03/21/21   [provider]  Vitamin D, Ergocalciferol, (DRISDOL) 1.25 MG (50000 UNIT) CAPS capsule Take 1 capsule (50,000 Units total) by mouth every 7 (seven) days for 12 doses. 08/14/21 10/31/21  Camillia Herter, NP    Family History Family History  Problem Relation Age of Onset   Lupus Mother    High Cholesterol Mother    Diabetes Father    Healthy Daughter    Healthy Son    Bipolar disorder Maternal Uncle    Bipolar disorder Maternal Grandmother  Schizophrenia Maternal Grandmother    Bipolar disorder Cousin    Colon cancer Neg Hx    Esophageal cancer Neg Hx    Rectal cancer Neg Hx    Stomach cancer Neg Hx     Social History Social History   Tobacco Use   Smoking status: Some Days    Packs/day: 0.25    Years: 10.00    Total pack years: 2.50    Types: Cigarettes   Smokeless tobacco: Never  Vaping Use   Vaping Use: Never used  Substance Use Topics   Alcohol use: Yes    Alcohol/week: 0.0 standard drinks of alcohol    Comment: occasional    Drug use: No     Allergies   Latex, Mangifera indica, Mango flavor, and Strawberry extract   Review of Systems Review of Systems Per HPI  Physical Exam Triage Vital Signs ED Triage Vitals  Enc Vitals Group     BP 10/22/21 1055 114/80      Pulse Rate 10/22/21 1055 95     Resp 10/22/21 1055 18     Temp 10/22/21 1055 98 F (36.7 C)     Temp src --      SpO2 10/22/21 1055 98 %     Weight --      Height --      Head Circumference --      Peak Flow --      Pain Score 10/22/21 1054 5     Pain Loc --      Pain Edu? --      Excl. in [place]? --    No data found.  Updated Vital Signs BP 114/80   Pulse 95   Temp 98 F (36.7 C)   Resp 18   LMP  (Within Days)   SpO2 98%   Visual Acuity Right Eye Distance: 20/35 Left Eye Distance: 20/25 Bilateral Distance: 20/20  Right Eye Near:   Left Eye Near:    Bilateral Near:     Physical Exam Constitutional:      General: She is not in acute distress.    Appearance: Normal appearance. She is not toxic-appearing or diaphoretic.  HENT:     Head: Normocephalic and atraumatic.  Eyes:     General: Lids are normal. Lids are everted, no foreign bodies appreciated. Vision grossly intact. Gaze aligned appropriately.     Extraocular Movements: Extraocular movements intact.     Conjunctiva/sclera: Conjunctivae normal.     Pupils: Pupils are equal, round, and reactive to light.     Right eye: Corneal abrasion and fluorescein uptake present.      Comments: Approximately 2 mm abrasions present to right cornea.  One is present in the upper eye and one is present in the lower eye.  Pulmonary:     Effort: Pulmonary effort is normal.  Neurological:     General: No focal deficit present.     Mental Status: She is alert and oriented to person, place, and time. Mental status is at baseline.  Psychiatric:        Mood and Affect: Mood normal.        Behavior: Behavior normal.        Thought Content: Thought content normal.        Judgment: Judgment normal.      UC Treatments / Results  Labs (all labs ordered are listed, but only abnormal results are displayed) Labs Reviewed - No data to display  EKG   Radiology No  results found.  Procedures Procedures (including critical  care time)  Medications Ordered in UC Medications - No data to display  Initial Impression / Assessment and Plan / UC Course  I have reviewed the triage vital signs and the nursing notes.  Pertinent labs & imaging results that were available during my care of the patient were reviewed by me and considered in my medical decision making (see chart for details).     Patient has 2 corneal abrasions to the right eye noted on fluorescein stain.  Will prescribe ofloxacin antibiotic drops given the patient wears contacts.  Patient was advised that she will need to follow-up with ophthalmology for further evaluation and management given corneal abrasion in the setting of contact lens wearing.  Patient provided with contact information for ophthalmology and advised to call today to schedule an appointment.  Visual acuity is normal.  Patient verbalized understanding and was agreeable with plan. Final Clinical Impressions(s) / UC Diagnoses   Final diagnoses:  Abrasion of right cornea, initial encounter     Discharge Instructions      It appears that you have a corneal abrasion which is a scratch of your eye.  This is being treated with antibiotic drops.  Please follow-up with provided contact information for ophthalmology for further evaluation and management today.    ED Prescriptions     Medication Sig Dispense Auth. Provider   ofloxacin (OCUFLOX) 0.3 % ophthalmic solution Place 1 drop into the right eye every 4 (four) hours for 2 days, THEN 1 drop 4 (four) times daily for 5 days. 5 mL Teodora Medici, [place]      PDMP not reviewed this encounter.   Teodora Medici, [place] 10/22/21 1159

## 2021-11-01 ENCOUNTER — Other Ambulatory Visit: Payer: Self-pay | Admitting: Family

## 2021-11-01 DIAGNOSIS — E559 Vitamin D deficiency, unspecified: Secondary | ICD-10-CM

## 2021-11-01 NOTE — Telephone Encounter (Signed)
Requested medication (s) are due for refill today:   Provider to review  Requested medication (s) are on the active medication list:   No  Future visit scheduled:   No   Last ordered: Not sure since it's not on her med list.  It's also a non delegated medication per the protocol.   Requested Prescriptions  Pending Prescriptions Disp Refills   Vitamin D, Ergocalciferol, (DRISDOL) 1.25 MG (50000 UNIT) CAPS capsule [Pharmacy Med Name: Vitamin D (Ergocalciferol) 1.25 MG (50000 UT) Oral Capsule] 4 capsule 0    Sig: Take 1 capsule by mouth once a week     Endocrinology:  Vitamins - Vitamin D Supplementation 2 Failed - 11/01/2021 10:01 AM      Failed - Manual Review: Route requests for 50,000 IU strength to the provider      Failed - Vitamin D in normal range and within 360 days    Vit D, 25-Hydroxy  Date Value Ref Range Status  08/10/2021 13.4 (L) 30.0 - 100.0 ng/mL Final    Comment:    Vitamin D deficiency has been defined by the Institute of Medicine and an Endocrine Society practice guideline as a level of serum 25-OH vitamin D less than 20 ng/mL (1,2). The Endocrine Society went on to further define vitamin D insufficiency as a level between 21 and 29 ng/mL (2). 1. IOM (Institute of Medicine). 2010. Dietary reference    intakes for calcium and D. [place]: The    [employer]. 2. Holick MF, [place], Bischoff-Ferrari HA, et al.    Evaluation, treatment, and prevention of vitamin D    deficiency: an Endocrine Society clinical practice    guideline. JCEM. 2011 Jul; 96(7):1911-30.          Passed - Ca in normal range and within 360 days    Calcium  Date Value Ref Range Status  08/10/2021 8.8 8.7 - 10.2 mg/dL Final         Passed - Valid encounter within last 12 months    Recent Outpatient Visits           1 week ago Encounter for weight management   Primary Care at [facility], [place], NP   1 month ago Encounter for weight management    Primary Care at [facility], Amy J, NP   2 months ago Annual physical exam   Primary Care at [facility], Amy J, NP   4 months ago Encounter to establish care   Primary Care at [facility], Flonnie [place]ail[place], NP   1 year ago Lumbar strain, initial encounter   Primary Care at [facility], Fenton Malling, MD              el

## 2022-03-25 ENCOUNTER — Ambulatory Visit
Admission: EM | Admit: 2022-03-25 | Discharge: 2022-03-25 | Disposition: A | Discharge status: Home / Self Care | Payer: No Typology Code available for payment source | Attending: Urgent Care | Admitting: Urgent Care

## 2022-03-25 DIAGNOSIS — H1013 Acute atopic conjunctivitis, bilateral: Secondary | ICD-10-CM | POA: Diagnosis not present

## 2022-03-25 DIAGNOSIS — J309 Allergic rhinitis, unspecified: Secondary | ICD-10-CM

## 2022-03-25 DIAGNOSIS — E559 Vitamin D deficiency, unspecified: Secondary | ICD-10-CM

## 2022-03-25 MED ORDER — VITAMIN D (ERGOCALCIFEROL) 1.25 MG (50000 UNIT) PO CAPS: 50000.0000 [IU] | ORAL_CAPSULE | ORAL | 0 refills | Status: DC

## 2022-03-25 MED ORDER — OLOPATADINE HCL 0.2 % OP SOLN: 1.0000 [drp] | Freq: Every day | OPHTHALMIC | 0 refills | Status: DC

## 2022-03-25 NOTE — ED Provider Notes (Signed)
[facility]    CSN: [id number] Arrival date & time: 03/25/22  1404      History   Chief Complaint Chief Complaint  Patient presents with   Conjunctivitis    HPI Neita Landrigan is a 41 y.o. female.   Pleasant 41 year old female with a known history of allergies presents today due to concerns of an intermittent 1 week history of itchy irritated eyes.  She states it affects both of them, but worse on the right.  States usually her allergy symptoms are in the summer, usually not in the fall.  Did have exposure to conjunctivitis greater than 1 week ago and wanted to make sure it was not "pinkeye".  She denies photophobia, blurred vision, or pain in the eye.  No lesions or swelling.  Usually wears contacts, has been wearing her glasses over the past week. Did have mild rhinorrhea/ scratchy throat but otherwise denies additional URI sx. Did not try any OTC medications.   Conjunctivitis    Past Medical History:  Diagnosis Date   Allergy    Anxiety    Bipolar disorder ([place])    Connective tissue disorder ([place]) 2017   Depression    Eczema    Heart murmur    as a child   Hyperlipidemia    during 2 nd pregnancy   PTSD (post-traumatic stress disorder)    Scarlet fever    as a child    Patient Active Problem List   Diagnosis Date Noted   Prediabetes 08/12/2021   Vitamin D deficiency 08/12/2021   Loss of weight 08/04/2019   Nausea without vomiting 08/04/2019   Diarrhea 08/04/2019   Encounter for other preprocedural examination 08/04/2019   Family history of breast cancer 10/29/2018   Disorder of connective tissue ([place]) 09/11/2016   Post concussion syndrome 07/18/2014   Visual disturbances 07/18/2014   Memory changes 07/18/2014   New onset headache 07/18/2014   Fatigue 07/18/2014    Past Surgical History:  Procedure Laterality Date   CHOLECYSTECTOMY  2013   SHOULDER SURGERY  2019   bone spur   WISDOM TOOTH EXTRACTION  2013, 2016    OB History    No obstetric history on file.      Home Medications    Prior to Admission medications   Medication Sig Start Date End Date Taking? Authorizing Provider  Olopatadine HCl 0.2 % SOLN Apply 1 drop to eye daily. 03/25/22  Yes Le Ferraz L, PA  ARIPiprazole (ABILIFY) 5 MG tablet Take 1 tablet (5 mg total) by mouth daily. 09/29/20 09/29/21  Derrill Center, NP  atomoxetine (STRATTERA) 80 MG capsule Take 80 mg by mouth every morning. 06/10/21   [provider]  divalproex (DEPAKOTE ER) 500 MG 24 hr tablet Take 1,000 mg by mouth daily.    [provider]  phentermine 37.5 MG capsule Take 1 capsule (37.5 mg total) by mouth every morning. 10/19/21 11/18/21  Camillia Herter, NP  Vitamin D, Ergocalciferol, (DRISDOL) 1.25 MG (50000 UNIT) CAPS capsule Take 1 capsule (50,000 Units total) by mouth once a week. 03/25/22   Chaney Malling, PA    Family History Family History  Problem Relation Age of Onset   Lupus Mother    High Cholesterol Mother    Diabetes Father    Healthy Daughter    Healthy Son    Bipolar disorder Maternal Uncle    Bipolar disorder Maternal Grandmother    Schizophrenia Maternal Grandmother    Bipolar disorder Cousin  Colon cancer Neg Hx    Esophageal cancer Neg Hx    Rectal cancer Neg Hx    Stomach cancer Neg Hx     Social History Social History   Tobacco Use   Smoking status: Some Days    Packs/day: 0.25    Years: 10.00    Total pack years: 2.50    Types: Cigarettes   Smokeless tobacco: Never  Vaping Use   Vaping Use: Never used  Substance Use Topics   Alcohol use: Yes    Alcohol/week: 0.0 standard drinks of alcohol    Comment: occasional    Drug use: No     Allergies   Latex, Mangifera indica, Mango flavor, and Strawberry extract   Review of Systems Review of Systems As per HPI  Physical Exam Triage Vital Signs ED Triage Vitals  Enc Vitals Group     BP 03/25/22 1528 139/82     Pulse Rate 03/25/22 1528 93     Resp 03/25/22  1528 16     Temp 03/25/22 1528 98.7 F (37.1 C)     Temp Source 03/25/22 1528 Oral     SpO2 03/25/22 1528 98 %     Weight --      Height --      Head Circumference --      Peak Flow --      Pain Score 03/25/22 1529 3     Pain Loc --      Pain Edu? --      Excl. in [place]? --    No data found.  Updated Vital Signs BP 139/82 (BP Location: Left Arm)   Pulse 93   Temp 98.7 F (37.1 C) (Oral)   Resp 16   SpO2 98%   Visual Acuity Right Eye Distance:   Left Eye Distance:   Bilateral Distance:    Right Eye Near:   Left Eye Near:    Bilateral Near:     Physical Exam Vitals and nursing note reviewed.  Constitutional:      General: She is not in acute distress.    Appearance: Normal appearance. She is not ill-appearing or toxic-appearing.  HENT:     Head: Normocephalic and atraumatic.     Right Ear: External ear normal.     Left Ear: External ear normal.     Nose: Nose normal. No congestion or rhinorrhea.     Mouth/Throat:     Mouth: Mucous membranes are moist.     Pharynx: Oropharynx is clear. No oropharyngeal exudate or posterior oropharyngeal erythema.  Eyes:     General: Lids are normal. Lids are everted, no foreign bodies appreciated. Vision grossly intact. Gaze aligned appropriately. Allergic shiner present. No visual field deficit or scleral icterus.       Right eye: No foreign body, discharge or hordeolum.        Left eye: No foreign body, discharge or hordeolum.     Extraocular Movements: Extraocular movements intact.     Right eye: Normal extraocular motion and no nystagmus.     Left eye: Normal extraocular motion and no nystagmus.     Conjunctiva/sclera: Conjunctivae normal.     Right eye: No chemosis, exudate or hemorrhage.    Left eye: No chemosis, exudate or hemorrhage.    Pupils: Pupils are equal, round, and reactive to light.     Right eye: Pupil is reactive and not sluggish.     Left eye: Pupil is reactive and not sluggish.  Visual Fields: Right eye  visual fields normal and left eye visual fields normal.     Comments: Minimal injection without ciliary flush to sclera bilaterally  Neurological:     Mental Status: She is alert.      UC Treatments / Results  Labs (all labs ordered are listed, but only abnormal results are displayed) Labs Reviewed - No data to display  EKG   Radiology No results found.  Procedures Procedures (including critical care time)  Medications Ordered in UC Medications - No data to display  Initial Impression / Assessment and Plan / UC Course  I have reviewed the triage vital signs and the nursing notes.  Pertinent labs & imaging results that were available during my care of the patient were reviewed by me and considered in my medical decision making (see chart for details).     Allergic conjunctivitis/ rhinitis - clinically sx are not consistent with viral or bacterial conjunctivitis. Ocular irritation appears to be primarily allergic in nature. Sx support with olopatadine. RTC precautions reviewed. Vit D deficiency - Vit D refilled per pt request. Last lab showed Vit D level of 13. Recommended follow up with PCP for recheck   Final Clinical Impressions(s) / UC Diagnoses   Final diagnoses:  Vitamin D deficiency  Allergic conjunctivitis and rhinitis, bilateral     Discharge Instructions      You appear to have an allergic conjunctivitis, which is not considered to be contagious. Please read the attached handout. Use the olopatadine eyedrops 1 drop daily on an as-needed basis. I have refilled your vitamin D, please take this once weekly and follow-up with your PCP for a recheck on your labs.     ED Prescriptions     Medication Sig Dispense Auth. Provider   Vitamin D, Ergocalciferol, (DRISDOL) 1.25 MG (50000 UNIT) CAPS capsule Take 1 capsule (50,000 Units total) by mouth once a week. 12 capsule Amilliana Hayworth L, PA   Olopatadine HCl 0.2 % SOLN Apply 1 drop to eye daily. 2.5 mL Kason Benak,  Anakin Varkey L, PA      PDMP not reviewed this encounter.   Chaney Malling, [place] 03/25/22 1709

## 2022-03-25 NOTE — Discharge Instructions (Addendum)
You appear to have an allergic conjunctivitis, which is not considered to be contagious. Please read the attached handout. Use the olopatadine eyedrops 1 drop daily on an as-needed basis. I have refilled your vitamin D, please take this once weekly and follow-up with your PCP for a recheck on your labs.

## 2022-03-25 NOTE — ED Triage Notes (Signed)
Pt c/o bilat ocular itching/burning sensation in both eyes but worse in right one. Onset ~ 1 week ago. States worse in morning and night.

## 2022-03-26 ENCOUNTER — Telehealth: Payer: Self-pay

## 2022-09-18 ENCOUNTER — Other Ambulatory Visit: Payer: Self-pay

## 2022-09-18 ENCOUNTER — Emergency Department (HOSPITAL_COMMUNITY)
Admission: EM | Admit: 2022-09-18 | Discharge: 2022-09-19 | Disposition: A | Discharge status: Home / Self Care | Payer: Medicaid Other | Attending: Emergency Medicine | Admitting: Emergency Medicine

## 2022-09-18 ENCOUNTER — Emergency Department (HOSPITAL_COMMUNITY): Payer: Medicaid Other

## 2022-09-18 DIAGNOSIS — R200 Anesthesia of skin: Secondary | ICD-10-CM | POA: Insufficient documentation

## 2022-09-18 DIAGNOSIS — Z91040 Latex allergy status: Secondary | ICD-10-CM | POA: Insufficient documentation

## 2022-09-18 LAB — CBC WITH DIFFERENTIAL/PLATELET
Abs Immature Granulocytes: 0.02 10*3/uL (ref 0.00–0.07)
Basophils Absolute: 0 10*3/uL (ref 0.0–0.1)
Basophils Relative: 0 %
Eosinophils Absolute: 0.1 10*3/uL (ref 0.0–0.5)
Eosinophils Relative: 1 %
HCT: 39.7 % (ref 36.0–46.0)
Hemoglobin: 12.6 g/dL (ref 12.0–15.0)
Immature Granulocytes: 0 %
Lymphocytes Relative: 35 %
Lymphs Abs: 2.4 10*3/uL (ref 0.7–4.0)
MCH: 27.2 pg (ref 26.0–34.0)
MCHC: 31.7 g/dL (ref 30.0–36.0)
MCV: 85.7 fL (ref 80.0–100.0)
Monocytes Absolute: 0.5 10*3/uL (ref 0.1–1.0)
Monocytes Relative: 7 %
Neutro Abs: 4 10*3/uL (ref 1.7–7.7)
Neutrophils Relative %: 57 %
Platelets: 342 10*3/uL (ref 150–400)
RBC: 4.63 MIL/uL (ref 3.87–5.11)
RDW: 15.7 % — ABNORMAL HIGH (ref 11.5–15.5)
WBC: 7 10*3/uL (ref 4.0–10.5)
nRBC: 0 % (ref 0.0–0.2)

## 2022-09-18 LAB — BASIC METABOLIC PANEL
Anion gap: 8 (ref 5–15)
BUN: 9 mg/dL (ref 6–20)
CO2: 22 mmol/L (ref 22–32)
Calcium: 8.8 mg/dL — ABNORMAL LOW (ref 8.9–10.3)
Chloride: 107 mmol/L (ref 98–111)
Creatinine, Ser: 0.7 mg/dL (ref 0.44–1.00)
GFR, Estimated: >60 mL/min (ref >60)
Glucose, Bld: 96 mg/dL (ref 70–99)
Potassium: 3.9 mmol/L (ref 3.5–5.1)
Sodium: 137 mmol/L (ref 135–145)

## 2022-09-18 LAB — MAGNESIUM: Magnesium: 2 mg/dL (ref 1.7–2.4)

## 2022-09-18 MED ORDER — GADOBUTROL 1 MMOL/ML IV SOLN
10.0000 mL | Freq: Once | INTRAVENOUS | Status: AC | PRN
  Administered 2022-09-18: 10 mL via INTRAVENOUS

## 2022-09-18 NOTE — ED Notes (Signed)
Patient returned from MRI.

## 2022-09-18 NOTE — ED Provider Notes (Signed)
[facility] AT [facility] Provider Note   CSN: [id number] Arrival date & time: 09/18/22  1505     History  Chief Complaint  Patient presents with   Numbness    Nicole [name] is a 42 y.o. female.  42 yo F with a cc of leg numbness has been going on since yesterday.  She tells me she just cannot feel it and then it feels weak.  She denies any pain to the back.  Has an issue with her back that occurred many years ago.  She denies loss of bowel or bladder denies loss of rectal sensation.  Feels like her leg is weak as well.  She denies recent trauma.  Denies fevers.  Denies IV drug use.        Home Medications Prior to Admission medications   Medication Sig Start Date End Date Taking? Authorizing Provider  ARIPiprazole (ABILIFY) 5 MG tablet Take 1 tablet (5 mg total) by mouth daily. 09/29/20 09/29/21  Oneta Rack, NP  atomoxetine (STRATTERA) 80 MG capsule Take 80 mg by mouth every morning. 06/10/21   [provider]  divalproex (DEPAKOTE ER) 500 MG 24 hr tablet Take 1,000 mg by mouth daily.    [provider]  Olopatadine HCl 0.2 % SOLN Apply 1 drop to eye daily. 03/25/22   Crain, Whitney L, PA  phentermine 37.5 MG capsule Take 1 capsule (37.5 mg total) by mouth every morning. 10/19/21 11/18/21  Rema Fendt, NP  Vitamin D, Ergocalciferol, (DRISDOL) 1.25 MG (50000 UNIT) CAPS capsule Take 1 capsule (50,000 Units total) by mouth once a week. 03/25/22   Crain, Alphonzo Lemmings L, PA      Allergies    Latex, Mangifera indica, Mango flavor, and Strawberry extract    Review of Systems   Review of Systems  Physical Exam Updated Vital Signs BP 128/89 (BP Location: Right Arm)   Pulse 77   Temp 98.7 F (37.1 C) (Oral)   Resp 18   Ht 5\' 6"  (1.676 m)   Wt 104.3 kg   SpO2 100%   BMI 37.12 kg/m  Physical Exam Vitals and nursing note reviewed.  Constitutional:      General: She is not in acute distress.    Appearance: She is  well-developed. She is not diaphoretic.  HENT:     Head: Normocephalic and atraumatic.  Eyes:     Pupils: Pupils are equal, round, and reactive to light.  Cardiovascular:     Rate and Rhythm: Normal rate and regular rhythm.     Heart sounds: No murmur heard.    No friction rub. No gallop.  Pulmonary:     Effort: Pulmonary effort is normal.     Breath sounds: No wheezing or rales.  Abdominal:     General: There is no distension.     Palpations: Abdomen is soft.     Tenderness: There is no abdominal tenderness.  Musculoskeletal:        General: No tenderness.     Cervical back: Normal range of motion and neck supple.     Comments: The patient has subjective decrease sensation to the plantar aspect of the left foot.  Otherwise pulse motor and sensation intact.  Reflexes are 1+ and equal.  No clonus.  Skin:    General: Skin is warm and dry.  Neurological:     Mental Status: She is alert and oriented to person, place, and time.  Psychiatric:  Behavior: Behavior normal.     ED Results / Procedures / Treatments   Labs (all labs ordered are listed, but only abnormal results are displayed) Labs Reviewed  CBC WITH DIFFERENTIAL/PLATELET - Abnormal; Notable for the following components:      Result Value   RDW 15.7 (*)    All other components within normal limits  BASIC METABOLIC PANEL - Abnormal; Notable for the following components:   Calcium 8.8 (*)    All other components within normal limits  MAGNESIUM    EKG None  Radiology No results found.  Procedures Procedures    Medications Ordered in ED Medications  gadobutrol (GADAVIST) 1 MMOL/ML injection 10 mL (has no administration in time range)    ED Course/ Medical Decision Making/ A&P                             Medical Decision Making Amount and/or Complexity of Data Reviewed Labs: ordered. Radiology: ordered.  Risk Prescription drug management.   42 yo F with a chief complaint of left leg  numbness.  This been going on for a couple days.  The way she is describing it this is not consistent with sciatica.  She was sitting in a position that I would anticipate that she has sciatica.  Patient adamantly refuses that she has pain in her back where that it is worse with movement outpatient or twisting.  I discussed the workup with her and she agrees.  MRI brain and L-spine.  No significant electrolyte abnormalities.  No anemia.  Awaiting MRI.  Signed out to Dr. Bebe Shaggy, please see their note for further details of care in the ED.  The patients results and plan were reviewed and discussed.   Any x-rays performed were independently reviewed by myself.   Differential diagnosis were considered with the presenting HPI.  Medications  gadobutrol (GADAVIST) 1 MMOL/ML injection 10 mL (has no administration in time range)    Vitals:   09/18/22 1538 09/18/22 1545 09/18/22 1930 09/18/22 2000  BP:  111/77 126/88 128/89  Pulse: 81  87 77  Resp:   18 18  Temp:      TempSrc:      SpO2: 99%  99% 100%  Weight:      Height:        Final diagnoses:  Numbness    Admission/ observation were discussed with the admitting physician, patient and/or family and they are comfortable with the plan.          Final Clinical Impression(s) / ED Diagnoses Final diagnoses:  Numbness    Rx / DC Orders ED Discharge Orders          Ordered    Ambulatory referral to Neurology       Comments: Left leg numbness   09/18/22 2257              Melene Plan, DO 09/18/22 2257

## 2022-09-18 NOTE — ED Triage Notes (Signed)
Pt arrived via POV. C/o numbness in L leg since yesterday afternoon. Pt has = strength and range of motion, but reduced sensation in L leg.  No other deficits.  AOx4

## 2022-09-18 NOTE — ED Notes (Signed)
Patient taken to MRI at this time via wheelchair.

## 2022-09-19 NOTE — ED Provider Notes (Signed)
I assumed care in signout to follow-up on imaging.  No acute findings on MRI brain or MRI lumbar spine  Patient is resting comfortably in no acute distress.  She is able to ambulate without difficulty. She has good perfusion in her legs. She has equal strength in both lower extremities.  Equal patellar reflexes are noted   At this point she is safe for discharge home.  No evidence of acute CVA.  No evidence of cauda equina or myelopathy. Low suspicion for Guillain-Barr or other significant neuropathy at this time However we discussed with patient for her to monitor her symptoms and if there is any abrupt worsening, weakness or incontinence that she should return to the ER immediately.  She was given referral to outpatient neurology      Zadie Rhine, MD 09/19/22 0020

## 2022-09-19 NOTE — ED Notes (Signed)
Patient given discharge instructions and follow up care. Patient verbalized understanding. IV removed with catheter intact. Patient ambulatory out of ED. 

## 2022-09-30 ENCOUNTER — Ambulatory Visit
Admission: EM | Admit: 2022-09-30 | Discharge: 2022-09-30 | Disposition: A | Discharge status: Home / Self Care | Payer: Medicaid Other | Attending: Family Medicine | Admitting: Family Medicine

## 2022-09-30 DIAGNOSIS — R200 Anesthesia of skin: Secondary | ICD-10-CM

## 2022-09-30 DIAGNOSIS — R29898 Other symptoms and signs involving the musculoskeletal system: Secondary | ICD-10-CM | POA: Diagnosis not present

## 2022-09-30 MED ORDER — METHYLPREDNISOLONE ACETATE 80 MG/ML IJ SUSP
80.0000 mg | Freq: Once | INTRAMUSCULAR | Status: AC
  Administered 2022-09-30: 80 mg via INTRAMUSCULAR

## 2022-09-30 NOTE — ED Triage Notes (Signed)
Pt c/o "I can't feel anything from my belly button down" left leg. States has weakness in right leg and right hand. Was seen in ED states MRI (-) but now is getting more progressive. Says neurology cannot see pt until August. Reports lapse in insurance has not been taking medications.

## 2022-09-30 NOTE — ED Provider Notes (Signed)
[facility]    CSN: [id number] Arrival date & time: 09/30/22  1101      History   Chief Complaint Chief Complaint  Patient presents with   weakness numbness    HPI Nicole [name] is a 42 y.o. female.   Patient is here for continued numbness and new weakness in her LE.  She was experiencing left sided leg numbness from her belly button down, and now also having numbness and weakness in her right LE.  She is also having numbness to the right hand, but not sure if related to carpal tunnel.  She was seen in the ER earlier this month.  Had and MRI of her brain and lumbar spine which was normal.  Blood work was also normal.  Here today b/c the right side is weak, not necessarily numbness like the left.  Difficulty with walking as a result.  No incontinence of bowel/bladder.  She is having difficulty working as a result of the hand weakness.  She did have carpal tunnel in the past and not sure if that is related either.  She has an apt with neuro in August. .        Past Medical History:  Diagnosis Date   Allergy    Anxiety    Bipolar disorder (HCC)    Connective tissue disorder (HCC) 2017   Depression    Eczema    Heart murmur    as a child   Hyperlipidemia    during 2 nd pregnancy   PTSD (post-traumatic stress disorder)    Scarlet fever    as a child    Patient Active Problem List   Diagnosis Date Noted   Prediabetes 08/12/2021   Vitamin D deficiency 08/12/2021   Loss of weight 08/04/2019   Nausea without vomiting 08/04/2019   Diarrhea 08/04/2019   Encounter for other preprocedural examination 08/04/2019   Family history of breast cancer 10/29/2018   Disorder of connective tissue (HCC) 09/11/2016   Post concussion syndrome 07/18/2014   Visual disturbances 07/18/2014   Memory changes 07/18/2014   New onset headache 07/18/2014   Fatigue 07/18/2014    Past Surgical History:  Procedure Laterality Date   CHOLECYSTECTOMY  2013   SHOULDER  SURGERY  2019   bone spur   WISDOM TOOTH EXTRACTION  2013, 2016    OB History   No obstetric history on file.      Home Medications    Prior to Admission medications   Medication Sig Start Date End Date Taking? Authorizing Provider  ARIPiprazole (ABILIFY) 5 MG tablet Take 1 tablet (5 mg total) by mouth daily. 09/29/20 09/29/21  Oneta Rack, NP  atomoxetine (STRATTERA) 80 MG capsule Take 80 mg by mouth every morning. 06/10/21   [provider]  divalproex (DEPAKOTE ER) 500 MG 24 hr tablet Take 1,000 mg by mouth daily.    [provider]  Olopatadine HCl 0.2 % SOLN Apply 1 drop to eye daily. 03/25/22   Crain, Whitney L, PA  phentermine 37.5 MG capsule Take 1 capsule (37.5 mg total) by mouth every morning. 10/19/21 11/18/21  Rema Fendt, NP  Vitamin D, Ergocalciferol, (DRISDOL) 1.25 MG (50000 UNIT) CAPS capsule Take 1 capsule (50,000 Units total) by mouth once a week. 03/25/22   Maretta Bees, PA    Family History Family History  Problem Relation Age of Onset   Lupus Mother    High Cholesterol Mother    Diabetes Father    Healthy  Daughter    Healthy Son    Bipolar disorder Maternal Uncle    Bipolar disorder Maternal Grandmother    Schizophrenia Maternal Grandmother    Bipolar disorder Cousin    Colon cancer Neg Hx    Esophageal cancer Neg Hx    Rectal cancer Neg Hx    Stomach cancer Neg Hx     Social History Social History   Tobacco Use   Smoking status: Some Days    Packs/day: 0.25    Years: 10.00    Additional pack years: 0.00    Total pack years: 2.50    Types: Cigarettes   Smokeless tobacco: Never  Vaping Use   Vaping Use: Never used  Substance Use Topics   Alcohol use: Yes    Alcohol/week: 0.0 standard drinks of alcohol    Comment: occasional    Drug use: No     Allergies   Latex, Mangifera indica, Mango flavor, and Strawberry extract   Review of Systems Review of Systems  Constitutional: Negative.   HENT: Negative.     Respiratory: Negative.    Cardiovascular: Negative.   Gastrointestinal: Negative.   Genitourinary: Negative.   Neurological:  Positive for weakness and numbness.     Physical Exam Triage Vital Signs ED Triage Vitals  Enc Vitals Group     BP 09/30/22 1158 120/80     Pulse Rate 09/30/22 1158 94     Resp 09/30/22 1158 16     Temp 09/30/22 1158 98.2 F (36.8 C)     Temp Source 09/30/22 1158 Oral     SpO2 09/30/22 1158 98 %     Weight --      Height --      Head Circumference --      Peak Flow --      Pain Score 09/30/22 1159 0     Pain Loc --      Pain Edu? --      Excl. in GC? --    No data found.  Updated Vital Signs BP 120/80 (BP Location: Left Arm)   Pulse 94   Temp 98.2 F (36.8 C) (Oral)   Resp 16   SpO2 98%   Visual Acuity Right Eye Distance:   Left Eye Distance:   Bilateral Distance:    Right Eye Near:   Left Eye Near:    Bilateral Near:     Physical Exam Constitutional:      Appearance: Normal appearance.  Cardiovascular:     Rate and Rhythm: Normal rate and regular rhythm.  Pulmonary:     Effort: Pulmonary effort is normal.     Breath sounds: Normal breath sounds.  Musculoskeletal:     Comments: Normal strength to the arm.  Decreased hand grip strength to the right hand compared to the left;  4/5 strength at the right foot and right leg  Skin:    General: Skin is warm and dry.  Neurological:     Mental Status: She is alert.  Psychiatric:        Mood and Affect: Mood normal.      UC Treatments / Results  Labs (all labs ordered are listed, but only abnormal results are displayed) Labs Reviewed - No data to display  EKG   Radiology No results found.  Procedures Procedures (including critical care time)  Medications Ordered in UC Medications  methylPREDNISolone acetate (DEPO-MEDROL) injection 80 mg (has no administration in time range)    Initial Impression / Assessment and  Plan / UC Course  I have reviewed the triage vital  signs and the nursing notes.  Pertinent labs & imaging results that were available during my care of the patient were reviewed by me and considered in my medical decision making (see chart for details).  She was seen today for weakness to the right leg and arm, which is new since her ER visit.  She does have weakness on exam today.  However, I do not see any emergent signs on exam.  MRI of brain/lumbar spine normal, lab work normal.  Discussed that I do not have any further work up to offer today.  If she has worsening symptoms then she needs to go to the ER for further work up.  She is requesting a steroid shot in case some of her weakness is due to carpal tunnel.  This was given today.  She will f/u with neuro in August. As directed.   Final Clinical Impressions(s) / UC Diagnoses   Final diagnoses:  Right hand weakness  Weakness of right leg  Left leg numbness     Discharge Instructions      You were seen today for leg and arm weakness.  Since you were in the ER and had a work up earlier this month, I do not having much to offer today that has not already been done.  I have given you a shot of steroid to see if helpful.  I understand this is scary for you.  If you have continued weakness or loss of bowel/bladder control, then please return to the ER for further testing.  Keep your appointment with neurology as already scheduled.     ED Prescriptions   None    PDMP not reviewed this encounter.   Jannifer Franklin, MD 09/30/22 1226

## 2022-09-30 NOTE — Discharge Instructions (Addendum)
You were seen today for leg and arm weakness.  Since you were in the ER and had a work up earlier this month, I do not having much to offer today that has not already been done.  I have given you a shot of steroid to see if helpful.  I understand this is scary for you.  If you have continued weakness or loss of bowel/bladder control, then please return to the ER for further testing.  Keep your appointment with neurology as already scheduled.

## 2022-11-20 ENCOUNTER — Encounter: Payer: Self-pay | Admitting: Neurology

## 2022-11-20 ENCOUNTER — Ambulatory Visit: Payer: Medicaid Other | Admitting: Neurology

## 2022-11-20 VITALS — BP 106/78 | HR 73 | Ht 66.75 in | Wt 202.0 lb

## 2022-11-20 DIAGNOSIS — R200 Anesthesia of skin: Secondary | ICD-10-CM

## 2022-11-20 DIAGNOSIS — R3989 Other symptoms and signs involving the genitourinary system: Secondary | ICD-10-CM

## 2022-11-20 DIAGNOSIS — G379 Demyelinating disease of central nervous system, unspecified: Secondary | ICD-10-CM

## 2022-11-20 DIAGNOSIS — G959 Disease of spinal cord, unspecified: Secondary | ICD-10-CM

## 2022-11-20 DIAGNOSIS — R198 Other specified symptoms and signs involving the digestive system and abdomen: Secondary | ICD-10-CM | POA: Diagnosis not present

## 2022-11-20 DIAGNOSIS — R29898 Other symptoms and signs involving the musculoskeletal system: Secondary | ICD-10-CM

## 2022-11-20 DIAGNOSIS — R208 Other disturbances of skin sensation: Secondary | ICD-10-CM

## 2022-11-20 NOTE — Patient Instructions (Signed)
Mri thoracic and cervical spine

## 2022-11-20 NOTE — Progress Notes (Signed)
[facility]    Provider:  Dr Lucia Gaskins Requesting Provider: Melene Plan, DO Primary Care Provider:  Rema Fendt, NP  CC:  Hemisensory loss below T5/T6  HPI:  Nicole [name] is a 42 y.o. female here as requested by Melene Plan, DO for left stomach and left leg weakness. In may woke up and below the belly button just on the left side even genitals midline down numbness. It was numb for 1.5 -2 weeks. Now still have sensory changes. No weakness. But it was the entire area, no dermatomal pattern, couldn't even feel the water going down her leg. A steroid shot helped. But has not resolved. Continuously numb and sme tingly like your foot is asleep, still at T10 level and below on the left. She has had bowel changes since then. Larey Seat the first day. No inciting events, woke up in her bed. Nothing makes it better or worse. Never had an episode like this before. No other focal neurologic deficits, associated symptoms, inciting events or modifiable factors.   Reviewed notes, labs and imaging from outside physicians, which showed:  Recent Results (from the past 2160 hour(s))  CBC with Differential     Status: Abnormal   Collection Time: 09/18/22  4:56 PM  Result Value Ref Range   WBC 7.0 4.0 - 10.5 K/uL   RBC 4.63 3.87 - 5.11 MIL/uL   Hemoglobin 12.6 12.0 - 15.0 g/dL   HCT 16.1 09.6 - 04.5 %   MCV 85.7 80.0 - 100.0 fL   MCH 27.2 26.0 - 34.0 pg   MCHC 31.7 30.0 - 36.0 g/dL   RDW 40.9 (H) 81.1 - 91.4 %   Platelets 342 150 - 400 K/uL   nRBC 0.0 0.0 - 0.2 %   Neutrophils Relative % 57 %   Neutro Abs 4.0 1.7 - 7.7 K/uL   Lymphocytes Relative 35 %   Lymphs Abs 2.4 0.7 - 4.0 K/uL   Monocytes Relative 7 %   Monocytes Absolute 0.5 0.1 - 1.0 K/uL   Eosinophils Relative 1 %   Eosinophils Absolute 0.1 0.0 - 0.5 K/uL   Basophils Relative 0 %   Basophils Absolute 0.0 0.0 - 0.1 K/uL   Immature Granulocytes 0 %   Abs Immature Granulocytes 0.02 0.00 - 0.07 K/uL    Comment:  Performed at [facility], [address], [place], [place] [address]  Basic metabolic panel     Status: Abnormal   Collection Time: 09/18/22  4:56 PM  Result Value Ref Range   Sodium 137 135 - 145 mmol/L   Potassium 3.9 3.5 - 5.1 mmol/L   Chloride 107 98 - 111 mmol/L   CO2 22 22 - 32 mmol/L   Glucose, Bld 96 70 - 99 mg/dL    Comment: Glucose reference range applies only to samples taken after fasting for at least 8 hours.   BUN 9 6 - 20 mg/dL   Creatinine, Ser 6.21 0.44 - 1.00 mg/dL   Calcium 8.8 (L) 8.9 - 10.3 mg/dL   GFR, Estimated >30 >86 mL/min    Comment: (NOTE) Calculated using the CKD-EPI Creatinine Equation (2021)    Anion gap 8 5 - 15    Comment: Performed at [facility], [address] [place], [place] [address]  Magnesium     Status: None   Collection Time: 09/18/22  4:56 PM  Result Value Ref Range   Magnesium 2.0 1.7 - 2.4 mg/dL    Comment: Performed at [facility], [address], [place], [place] [address]   MRI lumbar spine and mri brain 09/18/2022 EXAM: MRI LUMBAR SPINE WITHOUT AND WITH CONTRAST   TECHNIQUE: Multiplanar and multiecho pulse sequences of the lumbar spine were obtained without and with intravenous contrast.   CONTRAST:  10mL GADAVIST GADOBUTROL 1 MMOL/ML IV SOLN   COMPARISON:  None Available.   FINDINGS: Segmentation:  Standard.   Alignment:  Physiologic.   Vertebrae:  No fracture, evidence of discitis, or bone lesion.   Conus medullaris and cauda equina: Conus extends to the L2 level. Conus and cauda equina appear normal.   Paraspinal and other soft tissues: Negative.   Disc levels:   L1-L2: Normal disc space and facet joints. No spinal canal stenosis. No neural foraminal stenosis.   L2-L3: Normal disc space and facet joints. No spinal canal stenosis. No neural foraminal stenosis.   L3-L4: Normal disc space and facet joints. No spinal canal stenosis. No neural foraminal  stenosis.   L4-L5: Normal disc space and facet joints. No spinal canal stenosis. No neural foraminal stenosis.   L5-S1: Disc desiccation with minimal bulge. No spinal canal stenosis. No neural foraminal stenosis.   Visualized sacrum: Normal.   No abnormal contrast enhancement.   IMPRESSION: Mild degenerative disc disease at L5-S1 without spinal canal or neural foraminal stenosis.  MRI HEAD WITHOUT AND WITH CONTRAST   TECHNIQUE: Multiplanar, multiecho pulse sequences of the brain and surrounding structures were obtained without and with intravenous contrast.   CONTRAST:  10mL GADAVIST GADOBUTROL 1 MMOL/ML IV SOLN   COMPARISON:  None Available.   FINDINGS: Brain: No acute infarct, mass effect or extra-axial collection. No acute or chronic hemorrhage. Minimal multifocal hyperintense T2-weighted signal within the white matter. The midline structures are normal. There is no abnormal contrast enhancement.   Vascular: Major flow voids are preserved.   Skull and upper cervical spine: Normal calvarium and skull base. Visualized upper cervical spine and soft tissues are normal.   Sinuses/Orbits:No paranasal sinus fluid levels or advanced mucosal thickening. No mastoid or middle ear effusion. Normal orbits.   IMPRESSION: 1. No acute intracranial abnormality or evidence of demyelination. 2. Minimal multifocal hyperintense T2-weighted signal within the white matter, nonspecific but may be seen in the setting of migraine headaches or early chronic small vessel ischemia.  Review of Systems: Patient complains of symptoms per HPI as well as the following symptoms none. Pertinent negatives and positives per HPI. All others negative.   Social History   Socioeconomic History   Marital status: Married    Spouse name: Arther Abbott   Number of children: 3   Years of education: Not on file   Highest education level: Some college, no degree  Occupational History    Employer:  [employer]  Tobacco Use   Smoking status: Some Days    Packs/day: 0.25    Years: 10.00    Additional pack years: 0.00    Total pack years: 2.50    Types: Cigarettes   Smokeless tobacco: Never  Vaping Use   Vaping Use: Never used  Substance and Sexual Activity   Alcohol use: Yes    Alcohol/week: 0.0 standard drinks of alcohol    Comment: occasional    Drug use: No   Sexual activity: Yes  Other Topics Concern   Not on file  Social History Narrative   ** Merged History Encounter **       Patient lives at home with her spouse and children. Caffeine Use:  daily   Social Determinants of Health   Financial Resource Strain: Not on file  Food Insecurity: Not on file  Transportation Needs: Not on file  Physical Activity: Not on file  Stress: Not on file  Social Connections: Not on file  Intimate Partner Violence: Not on file    Family History  Problem Relation Age of Onset   Lupus Mother    High Cholesterol Mother    Diabetes Father    Healthy Daughter    Healthy Son    Bipolar disorder Maternal Uncle    Bipolar disorder Maternal Grandmother    Schizophrenia Maternal Grandmother    Bipolar disorder Cousin    Colon cancer Neg Hx    Esophageal cancer Neg Hx    Rectal cancer Neg Hx    Stomach cancer Neg Hx     Past Medical History:  Diagnosis Date   Allergy    Anxiety    Bipolar disorder (HCC)    Connective tissue disorder (HCC) 2017   Depression    Eczema    Heart murmur    as a child   Hyperlipidemia    during 2 nd pregnancy   PTSD (post-traumatic stress disorder)    Scarlet fever    as a child    Patient Active Problem List   Diagnosis Date Noted   Prediabetes 08/12/2021   Vitamin D deficiency 08/12/2021   Loss of weight 08/04/2019   Nausea without vomiting 08/04/2019   Diarrhea 08/04/2019   Encounter for other preprocedural examination 08/04/2019   Family history of breast cancer 10/29/2018   Disorder of connective tissue (HCC) 09/11/2016   Post  concussion syndrome 07/18/2014   Visual disturbances 07/18/2014   Memory changes 07/18/2014   New onset headache 07/18/2014   Fatigue 07/18/2014    Past Surgical History:  Procedure Laterality Date   CHOLECYSTECTOMY  2013   SHOULDER SURGERY  2019   bone spur   WISDOM TOOTH EXTRACTION  2013, 2016    Current Outpatient Medications  Medication Sig Dispense Refill   MELATONIN PO Take by mouth at bedtime as needed.     Vitamin D, Ergocalciferol, (DRISDOL) 1.25 MG (50000 UNIT) CAPS capsule Take 1 capsule (50,000 Units total) by mouth once a week. 12 capsule 0   No current facility-administered medications for this visit.    Allergies as of 11/20/2022 - Review Complete 11/20/2022  Allergen Reaction Noted   Latex Hives and Itching 02/19/2014   Mangifera indica Hives 10/29/2018   Mango flavor  04/12/2019   Strawberry extract Hives 10/29/2018    Vitals: BP 106/78 (BP Location: Right Arm, Patient Position: Sitting, Cuff Size: Large)   Pulse 73   Ht 5' 6.75" (1.695 m)   Wt 202 lb (91.6 kg)   BMI 31.88 kg/m  Last Weight:  Wt Readings from Last 1 Encounters:  11/20/22 202 lb (91.6 kg)   Last Height:   Ht Readings from Last 1 Encounters:  11/20/22 5' 6.75" (1.695 m)     Physical exam: Exam: Gen: NAD, conversant, well nourised, obese, well groomed                     CV: RRR, no MRG. No Carotid Bruits. No peripheral edema, warm, nontender Eyes: Conjunctivae clear without exudates or hemorrhage MSK: no pain on palpation of the spine  Neuro: Detailed Neurologic Exam  Speech:    Speech is normal; fluent and spontaneous with normal comprehension.  Cognition:    The patient is  oriented to person, place, and time;     recent and remote memory intact;     language fluent;     normal attention, concentration,     fund of knowledge Cranial Nerves:    The pupils are equal, round, and reactive to light. The ONH is flat, not edema. Visual fields are full to finger  confrontation. Extraocular movements are intact. Trigeminal sensation is intact and the muscles of mastication are normal. The face is symmetric. The palate elevates in the midline. Hearing intact. Voice is normal. Shoulder shrug is normal. The tongue has normal motion without fasciculations.   Coordination:   nml  Gait: Difficulty walking on left heel, wide based slightly  Motor Observation:    No asymmetry, no atrophy, and no involuntary movements noted. Tone:    Normal muscle tone.    Posture:    Posture is normal. normal erect    Strength: left hip flexion, leg flexion and foot dorsiflexion 4+/5, Strength is V/V in the upper and lower limbs.      Sensation: decreased sensation to pp below T5 on the left, sway on romberg     Reflex Exam:  DTR's:    Deep tendon reflexes in the upper and lower extremities are normal bilaterally.   Toes:    The toes are downgoing bilaterally.   Clonus:    Clonus is absent.    Assessment/Plan:  Patient with acute onset left hemisensory loss below t5-t6, left leg and thorax decreased sensation in all dermatomes, weakness in long tract sign (hip flexion,legflexion,foot dorsiflexion) has to be in the spinal cord either cervical or thoracic. Steroids helped a little, suspicious for multiple sclerosis.   MRI of the thoracic and cervical spine w/wo contrast to look for  a cervical cord lesion corresponding to dense hemisensory loss and weakness below T5/T6 evaluate for spinal stroke, demyelinating disease, myelopathy, multiple sclerosis, mass or other  Orders Placed This Encounter  Procedures   MR CERVICAL SPINE W WO CONTRAST   MR THORACIC SPINE W WO CONTRAST   Basic Metabolic Panel     Cc: Kandra Nicolas, NP  Naomie Dean, MD  [facility] [address] [place], [place] [address]  Phone [phone] Fax [phone]

## 2022-11-21 ENCOUNTER — Telehealth: Payer: Self-pay | Admitting: Neurology

## 2022-11-21 LAB — BASIC METABOLIC PANEL
BUN/Creatinine Ratio: 11 (ref 9–23)
BUN: 8 mg/dL (ref 6–24)
CO2: 20 mmol/L (ref 20–29)
Calcium: 9.5 mg/dL (ref 8.7–10.2)
Chloride: 103 mmol/L (ref 96–106)
Creatinine, Ser: 0.76 mg/dL (ref 0.57–1.00)
Glucose: 81 mg/dL (ref 70–99)
Potassium: 4.4 mmol/L (ref 3.5–5.2)
Sodium: 139 mmol/L (ref 134–144)
eGFR: 101 mL/min/{1.73_m2} (ref >59)

## 2022-11-21 NOTE — Telephone Encounter (Signed)
MRI thoracic spine [employer] medicaid auth: [id number] exp. 11/21/22-01/05/23 MRI cervical spine [employer] medicaid auth: [id number] exp. 11/21/22-01/05/23  sent to GI [phone]

## 2022-12-04 ENCOUNTER — Other Ambulatory Visit: Payer: Medicaid Other

## 2022-12-26 ENCOUNTER — Institutional Professional Consult (permissible substitution): Payer: No Typology Code available for payment source | Admitting: Neurology

## 2023-01-29 ENCOUNTER — Ambulatory Visit: Payer: Medicaid Other | Admitting: Neurology

## 2023-01-29 NOTE — Progress Notes (Deleted)
[facility]    Provider:  Dr Lucia Gaskins Requesting Provider: Rema Fendt, Nicole [name] Primary Care Provider:  Rema Fendt, Nicole [name]  CC:  Hemisensory loss below T5/T6  HPI:  Nicole [name] is a 42 y.o. female here as requested by Nicole Fendt, Nicole [name] for left stomach and left leg weakness. In may woke up and below the belly button just on the left side even genitals midline down numbness. It was numb for 1.5 -2 weeks. Now still have sensory changes. No weakness. But it was the entire area, no dermatomal pattern, couldn't even feel the water going down her leg. A steroid shot helped. But has not resolved. Continuously numb and sme tingly like your foot is asleep, still at T10 level and below on the left. She has had bowel changes since then. Larey Seat the first day. No inciting events, woke up in her bed. Nothing makes it better or worse. Never had an episode like this before. No other focal neurologic deficits, associated symptoms, inciting events or modifiable factors.   Reviewed notes, labs and imaging from outside physicians, which showed:  Recent Results (from the past 2160 hour(s))  Basic Metabolic Panel     Status: None   Collection Time: 11/20/22  2:58 PM  Result Value Ref Range   Glucose 81 70 - 99 mg/dL   BUN 8 6 - 24 mg/dL   Creatinine, Ser 4.69 0.57 - 1.00 mg/dL   eGFR 629 >52 WU/XLK/4.40   BUN/Creatinine Ratio 11 9 - 23   Sodium 139 134 - 144 mmol/L   Potassium 4.4 3.5 - 5.2 mmol/L   Chloride 103 96 - 106 mmol/L   CO2 20 20 - 29 mmol/L   Calcium 9.5 8.7 - 10.2 mg/dL   MRI lumbar spine and mri brain 09/18/2022 EXAM: MRI LUMBAR SPINE WITHOUT AND WITH CONTRAST   TECHNIQUE: Multiplanar and multiecho pulse sequences of the lumbar spine were obtained without and with intravenous contrast.   CONTRAST:  10mL GADAVIST GADOBUTROL 1 MMOL/ML IV SOLN   COMPARISON:  None Available.   FINDINGS: Segmentation:  Standard.   Alignment:  Physiologic.   Vertebrae:  No  fracture, evidence of discitis, or bone lesion.   Conus medullaris and cauda equina: Conus extends to the L2 level. Conus and cauda equina appear normal.   Paraspinal and other soft tissues: Negative.   Disc levels:   L1-L2: Normal disc space and facet joints. No spinal canal stenosis. No neural foraminal stenosis.   L2-L3: Normal disc space and facet joints. No spinal canal stenosis. No neural foraminal stenosis.   L3-L4: Normal disc space and facet joints. No spinal canal stenosis. No neural foraminal stenosis.   L4-L5: Normal disc space and facet joints. No spinal canal stenosis. No neural foraminal stenosis.   L5-S1: Disc desiccation with minimal bulge. No spinal canal stenosis. No neural foraminal stenosis.   Visualized sacrum: Normal.   No abnormal contrast enhancement.   IMPRESSION: Mild degenerative disc disease at L5-S1 without spinal canal or neural foraminal stenosis.  MRI HEAD WITHOUT AND WITH CONTRAST   TECHNIQUE: Multiplanar, multiecho pulse sequences of the brain and surrounding structures were obtained without and with intravenous contrast.   CONTRAST:  10mL GADAVIST GADOBUTROL 1 MMOL/ML IV SOLN   COMPARISON:  None Available.   FINDINGS: Brain: No acute infarct, mass effect or extra-axial collection. No acute or chronic hemorrhage. Minimal multifocal hyperintense T2-weighted signal within the white matter. The midline structures are normal. There is no abnormal contrast enhancement.  Vascular: Major flow voids are preserved.   Skull and upper cervical spine: Normal calvarium and skull base. Visualized upper cervical spine and soft tissues are normal.   Sinuses/Orbits:No paranasal sinus fluid levels or advanced mucosal thickening. No mastoid or middle ear effusion. Normal orbits.   IMPRESSION: 1. No acute intracranial abnormality or evidence of demyelination. 2. Minimal multifocal hyperintense T2-weighted signal within the white matter,  nonspecific but may be seen in the setting of migraine headaches or early chronic small vessel ischemia.  Review of Systems: Patient complains of symptoms per HPI as well as the following symptoms none. Pertinent negatives and positives per HPI. All others negative.   Social History   Socioeconomic History   Marital status: Married    Spouse name: Arther Abbott   Number of children: 3   Years of education: Not on file   Highest education level: Some college, no degree  Occupational History    Employer: [employer]  Tobacco Use   Smoking status: Some Days    Current packs/day: 0.25    Average packs/day: 0.3 packs/day for 10.0 years (2.5 ttl pk-yrs)    Types: Cigarettes   Smokeless tobacco: Never  Vaping Use   Vaping status: Never Used  Substance and Sexual Activity   Alcohol use: Yes    Alcohol/week: 0.0 standard drinks of alcohol    Comment: occasional    Drug use: No   Sexual activity: Yes  Other Topics Concern   Not on file  Social History Narrative   ** Merged History Encounter **       Patient lives at home with her spouse and children. Caffeine Use:  daily   Social Determinants of Health   Financial Resource Strain: Not on file  Food Insecurity: Not on file  Transportation Needs: Not on file  Physical Activity: Not on file  Stress: Not on file  Social Connections: Not on file  Intimate Partner Violence: Not on file    Family History  Problem Relation Age of Onset   Lupus Mother    High Cholesterol Mother    Diabetes Father    Healthy Daughter    Healthy Son    Bipolar disorder Maternal Uncle    Bipolar disorder Maternal Grandmother    Schizophrenia Maternal Grandmother    Bipolar disorder Cousin    Colon cancer Neg Hx    Esophageal cancer Neg Hx    Rectal cancer Neg Hx    Stomach cancer Neg Hx     Past Medical History:  Diagnosis Date   Allergy    Anxiety    Bipolar disorder (HCC)    Connective tissue disorder (HCC) 2017   Depression     Eczema    Heart murmur    as a child   Hyperlipidemia    during 2 nd pregnancy   PTSD (post-traumatic stress disorder)    Scarlet fever    as a child    Patient Active Problem List   Diagnosis Date Noted   Prediabetes 08/12/2021   Vitamin D deficiency 08/12/2021   Loss of weight 08/04/2019   Nausea without vomiting 08/04/2019   Diarrhea 08/04/2019   Encounter for other preprocedural examination 08/04/2019   Family history of breast cancer 10/29/2018   Disorder of connective tissue (HCC) 09/11/2016   Post concussion syndrome 07/18/2014   Visual disturbances 07/18/2014   Memory changes 07/18/2014   New onset headache 07/18/2014   Fatigue 07/18/2014    Past Surgical History:  Procedure Laterality Date  CHOLECYSTECTOMY  2013   SHOULDER SURGERY  2019   bone spur   WISDOM TOOTH EXTRACTION  2013, 2016    Current Outpatient Medications  Medication Sig Dispense Refill   MELATONIN PO Take by mouth at bedtime as needed.     Vitamin D, Ergocalciferol, (DRISDOL) 1.25 MG (50000 UNIT) CAPS capsule Take 1 capsule (50,000 Units total) by mouth once a week. 12 capsule 0   No current facility-administered medications for this visit.    Allergies as of 01/29/2023 - Review Complete 11/20/2022  Allergen Reaction Noted   Latex Hives and Itching 02/19/2014   Mangifera indica Hives 10/29/2018   Mango flavor  04/12/2019   Strawberry extract Hives 10/29/2018    Vitals: There were no vitals taken for this visit. Last Weight:  Wt Readings from Last 1 Encounters:  11/20/22 202 lb (91.6 kg)   Last Height:   Ht Readings from Last 1 Encounters:  11/20/22 5' 6.75" (1.695 m)     Physical exam: Exam: Gen: NAD, conversant, well nourised, obese, well groomed                     CV: RRR, no MRG. No Carotid Bruits. No peripheral edema, warm, nontender Eyes: Conjunctivae clear without exudates or hemorrhage MSK: no pain on palpation of the spine  Neuro: Detailed Neurologic  Exam  Speech:    Speech is normal; fluent and spontaneous with normal comprehension.  Cognition:    The patient is oriented to person, place, and time;     recent and remote memory intact;     language fluent;     normal attention, concentration,     fund of knowledge Cranial Nerves:    The pupils are equal, round, and reactive to light. The ONH is flat, not edema. Visual fields are full to finger confrontation. Extraocular movements are intact. Trigeminal sensation is intact and the muscles of mastication are normal. The face is symmetric. The palate elevates in the midline. Hearing intact. Voice is normal. Shoulder shrug is normal. The tongue has normal motion without fasciculations.   Coordination:   nml  Gait: Difficulty walking on left heel, wide based slightly  Motor Observation:    No asymmetry, no atrophy, and no involuntary movements noted. Tone:    Normal muscle tone.    Posture:    Posture is normal. normal erect    Strength: left hip flexion, leg flexion and foot dorsiflexion 4+/5, Strength is V/V in the upper and lower limbs.      Sensation: decreased sensation to pp below T5 on the left, sway on romberg     Reflex Exam:  DTR's:    Deep tendon reflexes in the upper and lower extremities are normal bilaterally.   Toes:    The toes are downgoing bilaterally.   Clonus:    Clonus is absent.    Assessment/Plan:  Patient with acute onset left hemisensory loss below t5-t6, left leg and thorax decreased sensation in all dermatomes, weakness in long tract sign (hip flexion,legflexion,foot dorsiflexion) has to be in the spinal cord either cervical or thoracic. Steroids helped a little, suspicious for multiple sclerosis.   MRI of the thoracic and cervical spine w/wo contrast to look for  a cervical cord lesion corresponding to dense hemisensory loss and weakness below T5/T6 evaluate for spinal stroke, demyelinating disease, myelopathy, multiple sclerosis, mass or  other  No orders of the defined types were placed in this encounter.    Cc: Nicole [name], Nicole  Shela Commons, Nicole [name],  Nicole Fendt, Nicole [name]  Nicole Dean, MD  [facility] [address] [place], [place] [address]  Phone [phone] Fax [phone]

## 2023-07-23 ENCOUNTER — Other Ambulatory Visit: Payer: Self-pay

## 2023-07-23 ENCOUNTER — Ambulatory Visit
Admission: EM | Admit: 2023-07-23 | Discharge: 2023-07-23 | Disposition: A | Discharge status: Home / Self Care | Attending: Physician Assistant | Admitting: Physician Assistant

## 2023-07-23 ENCOUNTER — Encounter: Payer: Self-pay | Admitting: *Deleted

## 2023-07-23 DIAGNOSIS — G5603 Carpal tunnel syndrome, bilateral upper limbs: Secondary | ICD-10-CM | POA: Diagnosis not present

## 2023-07-23 DIAGNOSIS — M79642 Pain in left hand: Secondary | ICD-10-CM | POA: Diagnosis not present

## 2023-07-23 DIAGNOSIS — M79641 Pain in right hand: Secondary | ICD-10-CM | POA: Diagnosis not present

## 2023-07-23 DIAGNOSIS — R202 Paresthesia of skin: Secondary | ICD-10-CM

## 2023-07-23 DIAGNOSIS — R200 Anesthesia of skin: Secondary | ICD-10-CM | POA: Diagnosis not present

## 2023-07-23 LAB — POCT FASTING CBG KUC MANUAL ENTRY: POCT Glucose (KUC): 85 mg/dL (ref 70–99)

## 2023-07-23 MED ORDER — METHYLPREDNISOLONE ACETATE 80 MG/ML IJ SUSP
60.0000 mg | Freq: Once | INTRAMUSCULAR | Status: AC
  Administered 2023-07-23: 60 mg via INTRAMUSCULAR

## 2023-07-23 MED ORDER — IBUPROFEN 600 MG PO TABS: 600.0000 mg | ORAL_TABLET | Freq: Three times a day (TID) | ORAL | 0 refills | Status: AC | PRN

## 2023-07-23 NOTE — Discharge Instructions (Signed)
 I am so that you are having a flare of your carpal tunnel.  We gave an injection of steroids today.  Start ibuprofen 3 times a day.  Do not take NSAIDs with this medication including aspirin, ibuprofen/Advil, naproxen/Aleve.  Use acetaminophen/Tylenol as needed for breakthrough pain.  Use the braces as much as possible particularly at night.  Follow-up with orthopedics for further evaluation and management.  Call them to schedule an appointment.  If anything worsens or changes and you have persistent numbness/tingling, weakness, fever, going to the bathroom on yourself, numbness on the inside of your legs you need to go to the ER.

## 2023-07-23 NOTE — ED Provider Notes (Signed)
 [facility]    CSN: [id number] Arrival date & time: 07/23/23  0906      History   Chief Complaint Chief Complaint  Patient presents with   Wrist Pain    HPI Nicole [name] is a 43 y.o. female.   Patient presents today with a several year history of intermittent bilateral wrist and hand pain.  She does have a history of carpal tunnel related to a nonspecified connective tissue disorder.  She reports that over the past several days this is significantly worsened and currently pain is rated 5/6 on a 0-10 pain scale, described as aching with periodic numbness and tingling, no alleviating factors identified.  She denies any recent steroid use.  She does report some numbness and tingling in her left leg which has been intermittent for years.  She has had MRIs and complete workup that was all negative.  She reports that in the past she has had the symptoms resolved with a cortisone injection and so is requesting this today.  She reports that several years ago she was diagnosed as prediabetic but has not been seen by her primary care recently.  She has been working very hard on losing weight and has lost approximately 30 pounds so is hopeful that this is reversed her metabolic dysfunction but is unsure.  Denies any polyuria, polydipsia, polyphasia.  She is confident that she is not pregnant.  She does not currently follow with rheumatology or orthopedics.  She is right-handed.  She denies any bowel/bladder incontinence, lower extremity weakness, saddle anesthesia, lower back pain.  Denies personal history of malignancy.    Past Medical History:  Diagnosis Date   Allergy    Anxiety    Bipolar disorder (HCC)    Connective tissue disorder (HCC) 2017   Depression    Eczema    Heart murmur    as a child   Hyperlipidemia    during 2 nd pregnancy   PTSD (post-traumatic stress disorder)    Scarlet fever    as a child    Patient Active Problem List   Diagnosis Date Noted    Prediabetes 08/12/2021   Vitamin D deficiency 08/12/2021   Loss of weight 08/04/2019   Nausea without vomiting 08/04/2019   Diarrhea 08/04/2019   Encounter for other preprocedural examination 08/04/2019   Family history of breast cancer 10/29/2018   Disorder of connective tissue (HCC) 09/11/2016   Post concussion syndrome 07/18/2014   Visual disturbances 07/18/2014   Memory changes 07/18/2014   New onset headache 07/18/2014   Fatigue 07/18/2014    Past Surgical History:  Procedure Laterality Date   CHOLECYSTECTOMY  2013   SHOULDER SURGERY  2019   bone spur   WISDOM TOOTH EXTRACTION  2013, 2016    OB History   No obstetric history on file.      Home Medications    Prior to Admission medications   Medication Sig Start Date End Date Taking? Authorizing Provider  ibuprofen (ADVIL) 600 MG tablet Take 1 tablet (600 mg total) by mouth every 8 (eight) hours as needed. 07/23/23  Yes Mazel Villela K, PA-C  MELATONIN PO Take by mouth at bedtime as needed. Patient not taking: Reported on 07/23/2023    [provider]  Vitamin D, Ergocalciferol, (DRISDOL) 1.25 MG (50000 UNIT) CAPS capsule Take 1 capsule (50,000 Units total) by mouth once a week. Patient not taking: Reported on 07/23/2023 03/25/22   Maretta Bees, PA    Family History Family History  Problem Relation Age of Onset   Lupus Mother    High Cholesterol Mother    Diabetes Father    Healthy Daughter    Healthy Son    Bipolar disorder Maternal Uncle    Bipolar disorder Maternal Grandmother    Schizophrenia Maternal Grandmother    Bipolar disorder Cousin    Colon cancer Neg Hx    Esophageal cancer Neg Hx    Rectal cancer Neg Hx    Stomach cancer Neg Hx     Social History Social History   Tobacco Use   Smoking status: Every Day    Current packs/day: 0.25    Average packs/day: 0.3 packs/day for 10.0 years (2.5 ttl pk-yrs)    Types: Cigarettes   Smokeless tobacco: Never  Vaping Use   Vaping  status: Never Used  Substance Use Topics   Alcohol use: Yes    Alcohol/week: 0.0 standard drinks of alcohol    Comment: occasional    Drug use: No     Allergies   Latex, Mangifera indica, Mango flavoring agent (non-screening), and Strawberry extract   Review of Systems Review of Systems  Constitutional:  Positive for activity change. Negative for appetite change, fatigue and fever.  Gastrointestinal:  Negative for abdominal pain, diarrhea, nausea and vomiting.  Endocrine: Negative for polydipsia, polyphagia and polyuria.  Musculoskeletal:  Positive for arthralgias. Negative for myalgias.  Skin:  Negative for color change and wound.  Neurological:  Positive for numbness. Negative for dizziness, weakness, light-headedness and headaches.     Physical Exam Triage Vital Signs ED Triage Vitals  Encounter Vitals Group     BP 07/23/23 1048 125/89     Systolic BP Percentile --      Diastolic BP Percentile --      Pulse Rate 07/23/23 1048 73     Resp 07/23/23 1048 18     Temp 07/23/23 1048 97.9 F (36.6 C)     Temp Source 07/23/23 1048 Oral     SpO2 07/23/23 1048 98 %     Weight --      Height --      Head Circumference --      Peak Flow --      Pain Score 07/23/23 1044 5     Pain Loc --      Pain Education --      Exclude from Growth Chart --    No data found.  Updated Vital Signs BP 125/89 (BP Location: Right Arm)   Pulse 73   Temp 97.9 F (36.6 C) (Oral)   Resp 18   LMP 07/07/2023   SpO2 98%   Visual Acuity Right Eye Distance:   Left Eye Distance:   Bilateral Distance:    Right Eye Near:   Left Eye Near:    Bilateral Near:     Physical Exam Vitals reviewed.  Constitutional:      General: She is awake. She is not in acute distress.    Appearance: Normal appearance. She is well-developed. She is not ill-appearing.     Comments: Very pleasant female appears stated age in no acute distress sitting comfortably in exam room  HENT:     Head: Normocephalic  and atraumatic.  Cardiovascular:     Rate and Rhythm: Normal rate and regular rhythm.     Heart sounds: Normal heart sounds, S1 normal and S2 normal. No murmur heard. Pulmonary:     Effort: Pulmonary effort is normal.     Breath sounds: Normal  breath sounds. No wheezing, rhonchi or rales.     Comments: Clear to auscultation bilaterally Abdominal:     Palpations: Abdomen is soft.     Tenderness: There is no abdominal tenderness.  Musculoskeletal:     Cervical back: No tenderness or bony tenderness.     Thoracic back: No tenderness or bony tenderness.     Lumbar back: No tenderness or bony tenderness. Negative right straight leg raise test and negative left straight leg raise test.     Comments: Back: No pain percussion of vertebrae.  No tenderness palpation of paraspinal muscles.  Normal active range of motion.  Strength 5/5 bilateral upper and lower extremities.  Hands: Normal pincer grip strength.  Hand is neurovascularly intact.  Positive Phalen's, negative Tinel's.  Psychiatric:        Behavior: Behavior is cooperative.      UC Treatments / Results  Labs (all labs ordered are listed, but only abnormal results are displayed) Labs Reviewed  POCT FASTING [facility] MANUAL ENTRY - Normal    EKG   Radiology No results found.  Procedures Procedures (including critical care time)  Medications Ordered in UC Medications  methylPREDNISolone acetate (DEPO-MEDROL) injection 60 mg (has no administration in time range)    Initial Impression / Assessment and Plan / UC Course  I have reviewed the triage vital signs and the nursing notes.  Pertinent labs & imaging results that were available during my care of the patient were reviewed by me and considered in my medical decision making (see chart for details).     Patient is well-appearing, afebrile, nontoxic, nontachycardic.  Concern for carpal tunnel given her clinical presentation.  She has responded positively to steroids in the  past and so she was given 60 mg of Depo-Medrol after having a normal fasting blood sugar in clinic.  Will also start ibuprofen 600 mg and we discussed that she is not to take NSAIDs with this medication and risk of GI bleeding but she can use Tylenol as needed for breakthrough pain.  We did discuss potential utility of blood work including B12 level, TSH, CBC, CMP, A1c, however, she reports having workup in the past that was all negative and these are her chronic symptoms so this was declined.  I did recommend that she follow-up with orthopedics for further evaluation and management and she was given the contact information for local provider with instruction call to schedule an appointment.  We discussed alarm symptoms that would warrant emergent evaluation.  She was given wrist braces to help with carpal tunnel symptoms and encouraged to use them as much as possible particularly at night.  Strict return precautions were given.  Excuse note was provided.  All questions were answered to her satisfaction.  Final Clinical Impressions(s) / UC Diagnoses   Final diagnoses:  Bilateral carpal tunnel syndrome  Bilateral hand pain  Numbness and tingling of left leg     Discharge Instructions      I am so that you are having a flare of your carpal tunnel.  We gave an injection of steroids today.  Start ibuprofen 3 times a day.  Do not take NSAIDs with this medication including aspirin, ibuprofen/Advil, naproxen/Aleve.  Use acetaminophen/Tylenol as needed for breakthrough pain.  Use the braces as much as possible particularly at night.  Follow-up with orthopedics for further evaluation and management.  Call them to schedule an appointment.  If anything worsens or changes and you have persistent numbness/tingling, weakness, fever, going to  the bathroom on yourself, numbness on the inside of your legs you need to go to the ER.     ED Prescriptions     Medication Sig Dispense Auth. Provider   ibuprofen  (ADVIL) 600 MG tablet Take 1 tablet (600 mg total) by mouth every 8 (eight) hours as needed. 30 tablet Leyton Brownlee, Noberto Retort, PA-C      PDMP not reviewed this encounter.   Jeani Hawking, PA-C 07/23/23 1126

## 2023-07-23 NOTE — ED Triage Notes (Signed)
 Pt c/o bilateral wrist pain from carpal tunnel- pain worst when she wakes up. Pain worse over the last 2 weeks but has had it for several years. Also c/o numbness in her lower extremities which she states is from an unspecified autoimmune condition. Last time she received steroids it helped with both.   Also states she hit her head on car door frame on Sunday and is still having pain

## 2023-10-24 ENCOUNTER — Ambulatory Visit: Admission: EM | Admit: 2023-10-24 | Discharge: 2023-10-24 | Disposition: A | Discharge status: Home / Self Care

## 2023-10-24 ENCOUNTER — Encounter: Payer: Self-pay | Admitting: Emergency Medicine

## 2023-10-24 DIAGNOSIS — J329 Chronic sinusitis, unspecified: Secondary | ICD-10-CM

## 2023-10-24 DIAGNOSIS — J40 Bronchitis, not specified as acute or chronic: Secondary | ICD-10-CM | POA: Diagnosis not present

## 2023-10-24 DIAGNOSIS — R051 Acute cough: Secondary | ICD-10-CM | POA: Diagnosis not present

## 2023-10-24 DIAGNOSIS — R112 Nausea with vomiting, unspecified: Secondary | ICD-10-CM | POA: Diagnosis not present

## 2023-10-24 LAB — POCT URINE PREGNANCY: Preg Test, Ur: NEGATIVE

## 2023-10-24 MED ORDER — ONDANSETRON 4 MG PO TBDP: 4.0000 mg | ORAL_TABLET | Freq: Three times a day (TID) | ORAL | 0 refills | Status: DC | PRN

## 2023-10-24 MED ORDER — AMOXICILLIN-POT CLAVULANATE 875-125 MG PO TABS: 1.0000 | ORAL_TABLET | Freq: Two times a day (BID) | ORAL | 0 refills | Status: DC

## 2023-10-24 NOTE — Discharge Instructions (Signed)
 Your urine pregnancy test was negative.  We are treating you for a sinus/bronchitis infection.  Start Augmentin twice daily for 7 days.  I also recommend fluticasone nasal spray for additional symptom relief.  Use over-the-counter medicine such as Mucinex, Tylenol, nasal saline/sinus rinses.  I have called in Zofran to help with your nausea.  You can use this every 8 hours as needed.  Eat a bland diet and drink plenty of fluid.  If you are not feeling better within a few days or if anything worsens and you have nausea/vomiting despite the medication, chest pain, shortness of breath, weakness you need to be seen immediately.

## 2023-10-24 NOTE — ED Triage Notes (Addendum)
 Pt presents c/o sore throat, ear pain, cough, headache, and dizziness x 1 week and a half. Pt also c/o emesis but denies diarrhea. Pt has taken DayQuil and garggled with salt water to improve the sxs.

## 2023-10-24 NOTE — ED Provider Notes (Signed)
 [facility]    CSN: [id number] Arrival date & time: 10/24/23  1457      History   Chief Complaint Chief Complaint  Patient presents with   Cough   Sore Throat   Otalgia   chest congestion     HPI Nicole [name] is a 43 y.o. female.   Patient presents today with a week and a half long history of URI symptoms.  Initially thought this was a mild URI but her symptoms have been worsening rather than improving prompting evaluation.  She reports left otalgia, sore throat, headache, cough, congestion, dizziness, nausea, vomiting.  She denies any chest pain, diarrhea, abdominal pain, weakness.  She has been taking DayQuil and NyQuil without improvement of symptoms.  She denies any known sick contacts.  Denies any recent antibiotics.  She does not believe that she could be pregnant but is open to testing.  She denies history of allergies, asthma, COPD, smoking.    Past Medical History:  Diagnosis Date   Allergy     Anxiety    Bipolar disorder (HCC)    Connective tissue disorder (HCC) 2017   Depression    Eczema    Heart murmur    as a child   Hyperlipidemia    during 2 nd pregnancy   PTSD (post-traumatic stress disorder)    Scarlet fever    as a child    Patient Active Problem List   Diagnosis Date Noted   Prediabetes 08/12/2021   Vitamin D  deficiency 08/12/2021   Loss of weight 08/04/2019   Nausea without vomiting 08/04/2019   Diarrhea 08/04/2019   Encounter for other preprocedural examination 08/04/2019   Family history of breast cancer 10/29/2018   Disorder of connective tissue (HCC) 09/11/2016   Anemia 09/11/2016   Constipation 09/11/2016   Depressive disorder 09/11/2016   Hypercholesterolemia 09/11/2016   Disorder of muscle 09/11/2016   Post concussion syndrome 07/18/2014   Visual disturbances 07/18/2014   Memory changes 07/18/2014   New onset headache 07/18/2014   Fatigue 07/18/2014    Past Surgical History:  Procedure Laterality Date    CHOLECYSTECTOMY  2013   SHOULDER SURGERY  2019   bone spur   WISDOM TOOTH EXTRACTION  2013, 2016    OB History   No obstetric history on file.      Home Medications    Prior to Admission medications   Medication Sig Start Date End Date Taking? Authorizing Provider  amoxicillin-clavulanate (AUGMENTIN) 875-125 MG tablet Take 1 tablet by mouth every 12 (twelve) hours. 10/24/23  Yes Early Ord K, PA-C  ondansetron (ZOFRAN-ODT) 4 MG disintegrating tablet Take 1 tablet (4 mg total) by mouth every 8 (eight) hours as needed for nausea or vomiting. 10/24/23  Yes Adlyn Fife K, PA-C  hydroxychloroquine (PLAQUENIL) 200 MG tablet take 2 tablets by mouth once daily WITH FOOD OR MILK    [provider]  ibuprofen  (ADVIL ) 600 MG tablet Take 1 tablet (600 mg total) by mouth every 8 (eight) hours as needed. 07/23/23   Francisca Harbuck K, PA-C  MELATONIN PO Take by mouth at bedtime as needed. Patient not taking: Reported on 07/23/2023    [provider]    Family History Family History  Problem Relation Age of Onset   Lupus Mother    High Cholesterol Mother    Diabetes Father    Healthy Daughter    Healthy Son    Bipolar disorder Maternal Uncle    Bipolar disorder Maternal Grandmother  Schizophrenia Maternal Grandmother    Bipolar disorder Cousin    Colon cancer Neg Hx    Esophageal cancer Neg Hx    Rectal cancer Neg Hx    Stomach cancer Neg Hx     Social History Social History   Tobacco Use   Smoking status: Every Day    Current packs/day: 0.25    Average packs/day: 0.3 packs/day for 10.0 years (2.5 ttl pk-yrs)    Types: Cigarettes    Passive exposure: Current   Smokeless tobacco: Never  Vaping Use   Vaping status: Never Used  Substance Use Topics   Alcohol use: Yes    Alcohol/week: 0.0 standard drinks of alcohol    Comment: occasional    Drug use: No     Allergies   Latex, Mangifera indica, Mango flavoring agent (non-screening), and Strawberry  extract   Review of Systems Review of Systems  Constitutional:  Positive for activity change. Negative for appetite change, fatigue and fever.  HENT:  Positive for congestion and sore throat. Negative for sinus pressure and sneezing.   Respiratory:  Positive for cough. Negative for shortness of breath.   Cardiovascular:  Negative for chest pain.  Gastrointestinal:  Positive for nausea and vomiting. Negative for abdominal pain and diarrhea.  Neurological:  Positive for headaches. Negative for dizziness and light-headedness.     Physical Exam Triage Vital Signs ED Triage Vitals  Encounter Vitals Group     BP 10/24/23 1607 132/81     Girls Systolic BP Percentile --      Girls Diastolic BP Percentile --      Boys Systolic BP Percentile --      Boys Diastolic BP Percentile --      Pulse Rate 10/24/23 1607 (!) 104     Resp 10/24/23 1607 20     Temp 10/24/23 1607 99 F (37.2 C)     Temp Source 10/24/23 1607 Oral     SpO2 10/24/23 1607 98 %     Weight 10/24/23 1606 201 lb 15.1 oz (91.6 kg)     Height --      Head Circumference --      Peak Flow --      Pain Score 10/24/23 1604 7     Pain Loc --      Pain Education --      Exclude from Growth Chart --    No data found.  Updated Vital Signs BP 132/81 (BP Location: Left Arm)   Pulse 84   Temp 99 F (37.2 C) (Oral)   Resp 20   Wt 201 lb 15.1 oz (91.6 kg)   LMP 09/27/2023 (Approximate)   SpO2 98%   BMI 31.87 kg/m   Visual Acuity Right Eye Distance:   Left Eye Distance:   Bilateral Distance:    Right Eye Near:   Left Eye Near:    Bilateral Near:     Physical Exam Vitals reviewed.  Constitutional:      General: She is awake. She is not in acute distress.    Appearance: Normal appearance. She is well-developed. She is not ill-appearing.     Comments: Very pleasant female appears stated age in no acute distress sitting comfortably in exam room  HENT:     Head: Normocephalic and atraumatic.     Right Ear: Ear canal  and external ear normal. Tympanic membrane is retracted. Tympanic membrane is not erythematous or bulging.     Left Ear: Tympanic membrane, ear canal and external  ear normal. Tympanic membrane is not erythematous or bulging.     Nose:     Right Sinus: Maxillary sinus tenderness and frontal sinus tenderness present.     Left Sinus: Maxillary sinus tenderness and frontal sinus tenderness present.     Mouth/Throat:     Pharynx: Uvula midline. Postnasal drip present. No oropharyngeal exudate or posterior oropharyngeal erythema.   Cardiovascular:     Rate and Rhythm: Normal rate and regular rhythm.     Heart sounds: Normal heart sounds, S1 normal and S2 normal. No murmur heard. Pulmonary:     Effort: Pulmonary effort is normal.     Breath sounds: Normal breath sounds. No wheezing, rhonchi or rales.     Comments: Clear to auscultation bilaterally Abdominal:     General: Bowel sounds are normal.     Palpations: Abdomen is soft.     Tenderness: There is no abdominal tenderness. There is no right CVA tenderness, left CVA tenderness, guarding or rebound.   Psychiatric:        Behavior: Behavior is cooperative.      UC Treatments / Results  Labs (all labs ordered are listed, but only abnormal results are displayed) Labs Reviewed  POCT URINE PREGNANCY - Normal    EKG   Radiology No results found.  Procedures Procedures (including critical care time)  Medications Ordered in UC Medications - No data to display  Initial Impression / Assessment and Plan / UC Course  I have reviewed the triage vital signs and the nursing notes.  Pertinent labs & imaging results that were available during my care of the patient were reviewed by me and considered in my medical decision making (see chart for details).     Patient is well-appearing, afebrile, nontoxic, nontachycardic.  No indication for viral testing as she has been symptomatic for several weeks and this would not change management.   Chest x-ray was deferred as she had no adventitious lung sounds on exam and her oxygen saturation was 98%.  Urine pregnancy was negative.  Will cover for sinobronchitis with Augmentin twice daily for 7 days.  She was encouraged use over-the-counter medication including Mucinex, Flonase, Tylenol, nasal saline/sinus rinses for additional symptom relief.  She does report some nausea and vomiting symptoms and so was given Zofran to be used every 8 hours as needed.  Recommended a bland diet and drink plenty of fluid.  She has not had persistent/severe vomiting and so low suspicion for severe dehydration or associated electrolyte abnormality and therefore basic blood work was deferred for the time being.  We did discuss that if she has worsening symptoms including chest pain, shortness of breath, weakness, nausea/vomiting despite antiemetic medication, abdominal pain she needs to be seen immediately.  Strict return precautions given.  Recommend close follow-up with primary care.  Excuse note provided.  Final Clinical Impressions(s) / UC Diagnoses   Final diagnoses:  Sinobronchitis  Acute cough  Nausea and vomiting, unspecified vomiting type     Discharge Instructions      Your urine pregnancy test was negative.  We are treating you for a sinus/bronchitis infection.  Start Augmentin twice daily for 7 days.  I also recommend fluticasone nasal spray for additional symptom relief.  Use over-the-counter medicine such as Mucinex, Tylenol, nasal saline/sinus rinses.  I have called in Zofran to help with your nausea.  You can use this every 8 hours as needed.  Eat a bland diet and drink plenty of fluid.  If you are not  feeling better within a few days or if anything worsens and you have nausea/vomiting despite the medication, chest pain, shortness of breath, weakness you need to be seen immediately.     ED Prescriptions     Medication Sig Dispense Auth. Provider   amoxicillin-clavulanate (AUGMENTIN) 875-125  MG tablet Take 1 tablet by mouth every 12 (twelve) hours. 14 tablet Gilles Trimpe K, PA-C   ondansetron (ZOFRAN-ODT) 4 MG disintegrating tablet Take 1 tablet (4 mg total) by mouth every 8 (eight) hours as needed for nausea or vomiting. 20 tablet Alyza Artiaga K, PA-C      PDMP not reviewed this encounter.   Budd Cargo, PA-C 10/24/23 1701

## 2023-11-07 ENCOUNTER — Encounter (HOSPITAL_COMMUNITY): Payer: Self-pay

## 2023-11-07 ENCOUNTER — Ambulatory Visit (HOSPITAL_COMMUNITY)
Admission: RE | Admit: 2023-11-07 | Discharge: 2023-11-07 | Disposition: A | Discharge status: Home / Self Care | Source: Ambulatory Visit | Attending: Nurse Practitioner

## 2023-11-07 VITALS — BP 128/89 | HR 74 | Temp 98.4°F | Resp 16

## 2023-11-07 DIAGNOSIS — S300XXA Contusion of lower back and pelvis, initial encounter: Secondary | ICD-10-CM

## 2023-11-07 DIAGNOSIS — J309 Allergic rhinitis, unspecified: Secondary | ICD-10-CM | POA: Diagnosis not present

## 2023-11-07 MED ORDER — NAPROXEN 500 MG PO TABS: 500.0000 mg | ORAL_TABLET | Freq: Two times a day (BID) | ORAL | 0 refills | Status: AC

## 2023-11-07 MED ORDER — PREDNISONE 20 MG PO TABS: 40.0000 mg | ORAL_TABLET | Freq: Every day | ORAL | 0 refills | Status: AC

## 2023-11-07 MED ORDER — CETIRIZINE-PSEUDOEPHEDRINE ER 5-120 MG PO TB12: 1.0000 | ORAL_TABLET | Freq: Every morning | ORAL | 0 refills | Status: AC

## 2023-11-07 MED ORDER — FLUTICASONE PROPIONATE 50 MCG/ACT NA SUSP: 2.0000 | Freq: Every day | NASAL | 0 refills | Status: AC

## 2023-11-07 NOTE — ED Triage Notes (Signed)
 Pt states that she has left ear pain and sore throat which she was seen for on 10/24/2023 when she finished the meds the sx came back. She states these sx came back 2 days ago.   She also fell and she thinks her tailbone is broken it is very painful to sit. She is taking IBU

## 2023-11-07 NOTE — ED Provider Notes (Signed)
 [facility]    CSN: [id number] Arrival date & time: 11/07/23  9081      History   Chief Complaint Chief Complaint  Patient presents with   Otalgia   Sore Throat   Tailbone Pain   Fall    HPI Nicole [name] is a 43 y.o. female.   Discussed the use of AI scribe software for clinical note transcription with the patient, who gave verbal consent to proceed.    Nicole [name] is a 43 y.o. female presents with multiple complaints including left ear pain, sore throat, and tailbone pain after a fall. The patient also reports recurrent sinus symptoms following recent antibiotic treatment. The patient fell on Saturday, resulting in tailbone pain described as a bruise. They report pain when sitting, standing up, or putting weight on one foot. Regarding the ear and throat symptoms, the patient reports a feeling of fullness in the left ear and pain when swallowing. They describe their throat as feeling like there's something in there and experiencing pain when swallowing like it's trying to go over it. The patient notes some nasal congestion but attributes this partly to allergies. They deny sinus pressure, fevers, chills, or current body aches, though they experienced body aches when first becoming ill. The patient also denies coughing. The patient was seen at this clinic on the 13th and diagnosed with a sinus infection, for which they were prescribed Augmentin . At that time, they had been sick for about a week and a half. The symptoms improved with antibiotics but have since returned, with the current illness duration now approximately one month. The patient reports chronic diarrhea, which they attribute to not having a gallbladder. They also mention having allergies but do not specify any current allergy  medications.  The following portions of the patient's history were reviewed and updated as appropriate: allergies, current medications, past family history, past  medical history, past social history, past surgical history, and problem list.    Past Medical History:  Diagnosis Date   Allergy     Anxiety    Bipolar disorder (HCC)    Connective tissue disorder (HCC) 2017   Depression    Eczema    Heart murmur    as a child   Hyperlipidemia    during 2 nd pregnancy   PTSD (post-traumatic stress disorder)    Scarlet fever    as a child    Patient Active Problem List   Diagnosis Date Noted   Prediabetes 08/12/2021   Vitamin D  deficiency 08/12/2021   Loss of weight 08/04/2019   Nausea without vomiting 08/04/2019   Diarrhea 08/04/2019   Encounter for other preprocedural examination 08/04/2019   Family history of breast cancer 10/29/2018   Disorder of connective tissue (HCC) 09/11/2016   Anemia 09/11/2016   Constipation 09/11/2016   Depressive disorder 09/11/2016   Hypercholesterolemia 09/11/2016   Disorder of muscle 09/11/2016   Post concussion syndrome 07/18/2014   Visual disturbances 07/18/2014   Memory changes 07/18/2014   New onset headache 07/18/2014   Fatigue 07/18/2014    Past Surgical History:  Procedure Laterality Date   CHOLECYSTECTOMY  2013   SHOULDER SURGERY  2019   bone spur   WISDOM TOOTH EXTRACTION  2013, 2016    OB History   No obstetric history on file.      Home Medications    Prior to Admission medications   Medication Sig Start Date End Date Taking? Authorizing Provider  cetirizine-pseudoephedrine (ZYRTEC-D) 5-120 MG tablet Take 1 tablet by  mouth in the morning for 10 days. 11/07/23 11/17/23 Yes Kerria Sapien, FNP  fluticasone (FLONASE) 50 MCG/ACT nasal spray Place 2 sprays into both nostrils daily. Shake well before use. Gently blow nose before spraying. Do not blow nose immediately after use. You should not taste the medication or feel it going down your throat; if you do, adjust your technique. 11/07/23  Yes Conlan Miceli, FNP  ibuprofen  (ADVIL ) 600 MG tablet Take 1 tablet (600 mg total) by  mouth every 8 (eight) hours as needed. 07/23/23  Yes Raspet, Erin K, PA-C  naproxen  (NAPROSYN ) 500 MG tablet Take 1 tablet (500 mg total) by mouth 2 (two) times daily with a meal. 11/07/23  Yes Iola Lukes, FNP  predniSONE  (DELTASONE ) 20 MG tablet Take 2 tablets (40 mg total) by mouth daily for 5 days. 11/07/23 11/12/23 Yes Edrie Ehrich, FNP  hydroxychloroquine (PLAQUENIL) 200 MG tablet take 2 tablets by mouth once daily WITH FOOD OR MILK    [provider]  MELATONIN PO Take by mouth at bedtime as needed. Patient not taking: Reported on 07/23/2023    [provider]    Family History Family History  Problem Relation Age of Onset   Lupus Mother    High Cholesterol Mother    Diabetes Father    Healthy Daughter    Healthy Son    Bipolar disorder Maternal Uncle    Bipolar disorder Maternal Grandmother    Schizophrenia Maternal Grandmother    Bipolar disorder Cousin    Colon cancer Neg Hx    Esophageal cancer Neg Hx    Rectal cancer Neg Hx    Stomach cancer Neg Hx     Social History Social History   Tobacco Use   Smoking status: Every Day    Current packs/day: 0.25    Average packs/day: 0.3 packs/day for 10.0 years (2.5 ttl pk-yrs)    Types: Cigarettes    Passive exposure: Current   Smokeless tobacco: Never  Vaping Use   Vaping status: Never Used  Substance Use Topics   Alcohol use: Yes    Alcohol/week: 0.0 standard drinks of alcohol    Comment: occasional    Drug use: No     Allergies   Latex, Mangifera indica, Mango flavoring agent (non-screening), and Strawberry extract   Review of Systems Review of Systems  Constitutional:  Negative for chills and fever.  HENT:  Positive for congestion, ear pain and sore throat. Negative for rhinorrhea, sinus pressure, sneezing and trouble swallowing.   Respiratory:  Negative for cough.   Gastrointestinal:  Positive for diarrhea. Negative for nausea and vomiting.  Musculoskeletal:  Positive for  arthralgias. Negative for myalgias.  Neurological:  Negative for headaches.  All other systems reviewed and are negative.    Physical Exam Triage Vital Signs ED Triage Vitals  Encounter Vitals Group     BP 11/07/23 0938 128/89     Girls Systolic BP Percentile --      Girls Diastolic BP Percentile --      Boys Systolic BP Percentile --      Boys Diastolic BP Percentile --      Pulse Rate 11/07/23 0938 74     Resp 11/07/23 0938 16     Temp 11/07/23 0938 98.4 F (36.9 C)     Temp Source 11/07/23 0938 Oral     SpO2 11/07/23 0938 98 %     Weight --      Height --      Head Circumference --  Peak Flow --      Pain Score 11/07/23 0936 4     Pain Loc --      Pain Education --      Exclude from Growth Chart --    No data found.  Updated Vital Signs BP 128/89 (BP Location: Right Arm)   Pulse 74   Temp 98.4 F (36.9 C) (Oral)   Resp 16   LMP 10/26/2023 (Approximate)   SpO2 98%   Visual Acuity Right Eye Distance:   Left Eye Distance:   Bilateral Distance:    Right Eye Near:   Left Eye Near:    Bilateral Near:     Physical Exam Vitals reviewed.  Constitutional:      General: She is awake. She is not in acute distress.    Appearance: Normal appearance. She is well-developed. She is not ill-appearing, toxic-appearing or diaphoretic.  HENT:     Head: Normocephalic.     Right Ear: Hearing, tympanic membrane, ear canal and external ear normal.     Left Ear: Hearing, tympanic membrane, ear canal and external ear normal.     Nose: Nose normal.     Mouth/Throat:     Lips: Pink.     Mouth: Mucous membranes are moist.     Pharynx: Oropharynx is clear. Uvula midline.   Eyes:     General: Vision grossly intact.     Conjunctiva/sclera: Conjunctivae normal.    Cardiovascular:     Rate and Rhythm: Normal rate and regular rhythm.     Heart sounds: Normal heart sounds.  Pulmonary:     Effort: Pulmonary effort is normal.     Breath sounds: Normal breath sounds and air  entry.   Musculoskeletal:        General: Normal range of motion.     Cervical back: Normal range of motion and neck supple.       Legs:     Comments: Coccyx tenderness   Lymphadenopathy:     Cervical: No cervical adenopathy.   Skin:    General: Skin is warm and dry.   Neurological:     General: No focal deficit present.     Mental Status: She is alert and oriented to person, place, and time.     Sensory: Sensation is intact. No sensory deficit.     Motor: Motor function is intact. No weakness.     Coordination: Coordination is intact.     Gait: Gait is intact.   Psychiatric:        Behavior: Behavior is cooperative.      UC Treatments / Results  Labs (all labs ordered are listed, but only abnormal results are displayed) Labs Reviewed - No data to display  EKG   Radiology No results found.  Procedures Procedures (including critical care time)  Medications Ordered in UC Medications - No data to display  Initial Impression / Assessment and Plan / UC Course  I have reviewed the triage vital signs and the nursing notes.  Pertinent labs & imaging results that were available during my care of the patient were reviewed by me and considered in my medical decision making (see chart for details).     Patient presents with recurrent upper respiratory symptoms, including sore throat, left ear pain and fullness, and mild nasal congestion. Symptoms initially began about one month ago and temporarily improved with Augmentin . No fever, chills, or systemic symptoms are present. Examination showed clear ear canals, no lymphadenopathy, and no signs of  active infection. Given the lack of acute findings and the patient's history of allergies, symptoms are most consistent with allergic rhinitis. Treatment includes Zyrtec-D once daily and Flonase, along with nightly humidifier use. Patient advised to monitor symptoms and follow up with PCP if no improvement or worsening symptoms such as  high fever, severe sinus pressure, or purulent drainage develop.  Additionally, patient reports a fall on Saturday resulting in pain over the buttocks, consistent with a coccygeal contusion. Pain is worse with sitting, standing, and bearing weight on one foot. Naproxen  500 mg BID prescribed for inflammation and pain relief, with instructions to avoid other NSAIDs while taking naproxen . Tylenol may be used as needed. Patient advised to alternate between ice and heat and use a donut pillow when sitting. Referred for orthopedic evaluation if symptoms fail to improve over time or worsen.  Today's evaluation has revealed no signs of a dangerous process. Discussed diagnosis with patient and/or guardian. Patient and/or guardian aware of their diagnosis, possible red flag symptoms to watch out for and need for close follow up. Patient and/or guardian understands verbal and written discharge instructions. Patient and/or guardian comfortable with plan and disposition.  Patient and/or guardian has a clear mental status at this time, good insight into illness (after discussion and teaching) and has clear judgment to make decisions regarding their care  Documentation was completed with the aid of voice recognition software. Transcription may contain typographical errors. Final Clinical Impressions(s) / UC Diagnoses   Final diagnoses:  Allergic sinusitis  Contusion of coccyx, initial encounter     Discharge Instructions      You were seen today for recurrent symptoms of sore throat, ear discomfort, and nasal congestion. These symptoms initially improved with antibiotics but have since returned. Based on your exam and history of allergies, your symptoms are most likely related to allergic rhinitis rather than a bacterial infection. You were prescribed Zyrtec-D once daily and Flonase nasal spray to help reduce allergy -related inflammation. Using a humidifier at night may also help relieve congestion and  dryness.  You also reported buttock pain after a recent fall, consistent with a coccygeal contusion. This type of injury can cause pain when sitting, standing, or bearing weight. You were prescribed naproxen  500 mg twice daily to reduce inflammation and pain. Do not take other NSAIDs such as ibuprofen  or Aleve  while using naproxen . You may take Tylenol if you need additional pain relief. Apply ice for 15-20 minutes at a time during the first 48 hours, then alternate with heat to help with muscle tension. Using a donut pillow when sitting can relieve pressure on the tailbone.  Follow up with your primary care provider if your allergy  symptoms do not improve within a week, or if you develop fever, facial pain, green nasal discharge, or worsening ear pain. For your tailbone injury, see an orthopedic specialist if symptoms persist beyond 1-2 weeks, worsen, or interfere significantly with daily activities.   Go to the emergency department if you experience loss of bowel or bladder control, severe weakness, numbness, or inability to walk.      ED Prescriptions     Medication Sig Dispense Auth. Provider   cetirizine-pseudoephedrine (ZYRTEC-D) 5-120 MG tablet Take 1 tablet by mouth in the morning for 10 days. 10 tablet Iola Lukes, FNP   predniSONE  (DELTASONE ) 20 MG tablet Take 2 tablets (40 mg total) by mouth daily for 5 days. 10 tablet Iola Lukes, FNP   fluticasone (FLONASE) 50 MCG/ACT nasal spray Place 2 sprays into  both nostrils daily. Shake well before use. Gently blow nose before spraying. Do not blow nose immediately after use. You should not taste the medication or feel it going down your throat; if you do, adjust your technique. 16 g Iola Lukes, FNP   naproxen  (NAPROSYN ) 500 MG tablet Take 1 tablet (500 mg total) by mouth 2 (two) times daily with a meal. 20 tablet Iola Lukes, FNP      PDMP not reviewed this encounter.   Iola Lukes, [place] 11/07/23 1007

## 2023-11-07 NOTE — Discharge Instructions (Addendum)
 You were seen today for recurrent symptoms of sore throat, ear discomfort, and nasal congestion. These symptoms initially improved with antibiotics but have since returned. Based on your exam and history of allergies, your symptoms are most likely related to allergic rhinitis rather than a bacterial infection. You were prescribed Zyrtec-D once daily and Flonase nasal spray to help reduce allergy -related inflammation. Using a humidifier at night may also help relieve congestion and dryness.  You also reported buttock pain after a recent fall, consistent with a coccygeal contusion. This type of injury can cause pain when sitting, standing, or bearing weight. You were prescribed naproxen  500 mg twice daily to reduce inflammation and pain. Do not take other NSAIDs such as ibuprofen  or Aleve  while using naproxen . You may take Tylenol if you need additional pain relief. Apply ice for 15-20 minutes at a time during the first 48 hours, then alternate with heat to help with muscle tension. Using a donut pillow when sitting can relieve pressure on the tailbone.  Follow up with your primary care provider if your allergy  symptoms do not improve within a week, or if you develop fever, facial pain, green nasal discharge, or worsening ear pain. For your tailbone injury, see an orthopedic specialist if symptoms persist beyond 1-2 weeks, worsen, or interfere significantly with daily activities.   Go to the emergency department if you experience loss of bowel or bladder control, severe weakness, numbness, or inability to walk.

## 2023-11-10 ENCOUNTER — Telehealth: Payer: Self-pay | Admitting: Family

## 2023-11-10 NOTE — Telephone Encounter (Signed)
 Left voicemail for patient to call office back so we can get her scheduled for hospital follow up with her provider .

## 2024-05-07 ENCOUNTER — Ambulatory Visit (HOSPITAL_COMMUNITY)
Admission: EM | Admit: 2024-05-07 | Discharge: 2024-05-07 | Disposition: A | Discharge status: Home / Self Care | Attending: Internal Medicine | Admitting: Internal Medicine

## 2024-05-07 ENCOUNTER — Encounter (HOSPITAL_COMMUNITY): Payer: Self-pay | Admitting: *Deleted

## 2024-05-07 ENCOUNTER — Other Ambulatory Visit: Payer: Self-pay

## 2024-05-07 DIAGNOSIS — J101 Influenza due to other identified influenza virus with other respiratory manifestations: Secondary | ICD-10-CM | POA: Diagnosis present

## 2024-05-07 DIAGNOSIS — N920 Excessive and frequent menstruation with regular cycle: Secondary | ICD-10-CM | POA: Insufficient documentation

## 2024-05-07 DIAGNOSIS — R42 Dizziness and giddiness: Secondary | ICD-10-CM | POA: Diagnosis present

## 2024-05-07 DIAGNOSIS — H8111 Benign paroxysmal vertigo, right ear: Secondary | ICD-10-CM | POA: Insufficient documentation

## 2024-05-07 DIAGNOSIS — R112 Nausea with vomiting, unspecified: Secondary | ICD-10-CM | POA: Insufficient documentation

## 2024-05-07 LAB — BASIC METABOLIC PANEL WITH GFR
Anion gap: 11 (ref 5–15)
BUN: 9 mg/dL (ref 6–20)
CO2: 26 mmol/L (ref 22–32)
Calcium: 9.5 mg/dL (ref 8.9–10.3)
Chloride: 100 mmol/L (ref 98–111)
Creatinine, Ser: 0.76 mg/dL (ref 0.44–1.00)
GFR, Estimated: >60 mL/min
Glucose, Bld: 90 mg/dL (ref 70–99)
Potassium: 4.2 mmol/L (ref 3.5–5.1)
Sodium: 137 mmol/L (ref 135–145)

## 2024-05-07 LAB — CBC
HCT: 40.2 % (ref 36.0–46.0)
Hemoglobin: 13.5 g/dL (ref 12.0–15.0)
MCH: 29 pg (ref 26.0–34.0)
MCHC: 33.6 g/dL (ref 30.0–36.0)
MCV: 86.3 fL (ref 80.0–100.0)
Platelets: 322 K/uL (ref 150–400)
RBC: 4.66 MIL/uL (ref 3.87–5.11)
RDW: 14.7 % (ref 11.5–15.5)
WBC: 4.6 K/uL (ref 4.0–10.5)
nRBC: 0 % (ref 0.0–0.2)

## 2024-05-07 LAB — POCT INFLUENZA A/B
Influenza A, POC: NEGATIVE
Influenza B, POC: POSITIVE — AB

## 2024-05-07 MED ORDER — GUAIFENESIN ER 600 MG PO TB12: 600.0000 mg | ORAL_TABLET | Freq: Two times a day (BID) | ORAL | 0 refills | Status: AC

## 2024-05-07 MED ORDER — PROMETHAZINE-DM 6.25-15 MG/5ML PO SYRP: 5.0000 mL | ORAL_SOLUTION | Freq: Every evening | ORAL | 0 refills | Status: AC | PRN

## 2024-05-07 MED ORDER — ONDANSETRON 4 MG PO TBDP: 4.0000 mg | ORAL_TABLET | Freq: Three times a day (TID) | ORAL | 0 refills | Status: AC | PRN

## 2024-05-07 MED ORDER — MECLIZINE HCL 12.5 MG PO TABS: 12.5000 mg | ORAL_TABLET | Freq: Three times a day (TID) | ORAL | 0 refills | Status: AC | PRN

## 2024-05-07 MED ORDER — OSELTAMIVIR PHOSPHATE 75 MG PO CAPS: 75.0000 mg | ORAL_CAPSULE | Freq: Two times a day (BID) | ORAL | 0 refills | Status: AC

## 2024-05-07 NOTE — Discharge Instructions (Addendum)
 You have been evaluated today for dizziness. Your evaluation suggests that your symptoms are most likely due to vertigo and dehydration.  I have also checked your blood for anemia, this can cause dizziness due to losing too much blood when you have your menstrual cycle.  Blood work will come back in 24-48 hours and staff will call you if your blood work is abnormal.   You have been prescribed meclizine  to help relieve your symptoms. This medicine can make you sleepy, so do not take this and drive, drink alcohol, or go to work. Please take your prescription as directed.   Please follow up with your primary care provider for further management. Drink plenty of water to stay well hydrated.  You also have the FLU! Flu B.  Take Tamiflu  every 12 hours for the next 5 days to improve symptoms and stop the virus from replicating in your body. Ibuprofen /tylenol as needed for fevers and body aches. Mucinex  as needed for nasal congestion. Zofran  as needed for nausea/vomiting. Take promethazine  DM cough syrup at bedtime as needed for coughing.  Only take this at bedtime as it will make you sleepy.  If your symptoms do not improve in the next 2-3 days with interventions, please return. Return immediately for worsening or uncontrolled symptoms, worsening headache, chest pain, shortness of breath, persistent vomiting, vision changes, fainting, or for any other concerning symptoms.  I hope you feel better!

## 2024-05-07 NOTE — ED Provider Notes (Signed)
 " [facility]    CSN: [id number] Arrival date & time: 05/07/24  9177      History   Chief Complaint Chief Complaint  Patient presents with   Emesis   Nausea   Dizziness    HPI Nicole [name] is a 43 y.o. female.   Nicole [name] is a 43 y.o. female presenting for chief complaint of cough, nasal congestion, nausea, vomiting, generalized fatigue, and bodyaches that started 3 days ago on May 04, 2024.  No recent sick contacts with similar symptoms.  Cough is minimally productive with clear sputum.  She denies shortness of breath, chest pain, heart palpitations, and rashes.  She has had 1-2 episodes of nonbilious/nonbloody emesis over the last 24 to 48 hours.  She states she suffers from nausea, vomiting, and dizziness every time after her menstrual cycle ends.  Her menstrual cycle ended a few days ago and she states her postmenstrual cycle symptoms do not usually last this long.  She denies recent fevers and chills. Additionally reports postural dizziness that is worse when she lays on her right side and when she moves her head at her neck.  Dizziness has been persistent for the last several months and is not new.  She admits to minimal water intake and mostly drinks sweet tea.  She has never been diagnosed with vertigo in the past and denies history of anemia.  She denies unilateral extremity weakness, headache, visual disturbance, syncope, chest pain/diaphoresis associated with dizziness, jaw pain, back pain, urinary symptoms, and flank pain.  No blood or mucus in the stools or gross hematuria.  She has not attempted use of any over-the-counter medications to help with symptoms prior to arrival.   Emesis Dizziness Associated symptoms: vomiting     Past Medical History:  Diagnosis Date   Allergy     Anxiety    Bipolar disorder (HCC)    Connective tissue disorder 2017   Depression    Eczema    Heart murmur    as a child   Hyperlipidemia    during 2  nd pregnancy   PTSD (post-traumatic stress disorder)    Scarlet fever    as a child    Patient Active Problem List   Diagnosis Date Noted   Prediabetes 08/12/2021   Vitamin D  deficiency 08/12/2021   Loss of weight 08/04/2019   Nausea without vomiting 08/04/2019   Diarrhea 08/04/2019   Encounter for other preprocedural examination 08/04/2019   Family history of breast cancer 10/29/2018   Disorder of connective tissue 09/11/2016   Anemia 09/11/2016   Constipation 09/11/2016   Depressive disorder 09/11/2016   Hypercholesterolemia 09/11/2016   Disorder of muscle 09/11/2016   Post concussion syndrome 07/18/2014   Visual disturbances 07/18/2014   Memory changes 07/18/2014   New onset headache 07/18/2014   Fatigue 07/18/2014    Past Surgical History:  Procedure Laterality Date   CHOLECYSTECTOMY  2013   SHOULDER SURGERY  2019   bone spur   WISDOM TOOTH EXTRACTION  2013, 2016    OB History   No obstetric history on file.      Home Medications    Prior to Admission medications  Medication Sig Start Date End Date Taking? Authorizing Provider  guaiFENesin  (MUCINEX ) 600 MG 12 hr tablet Take 1 tablet (600 mg total) by mouth 2 (two) times daily. 05/07/24  Yes Enedelia Dorna HERO, FNP  ibuprofen  (ADVIL ) 600 MG tablet Take 1 tablet (600 mg total) by mouth every 8 (eight) hours as needed.  07/23/23  Yes Raspet, Erin K, PA-C  meclizine  (ANTIVERT ) 12.5 MG tablet Take 1 tablet (12.5 mg total) by mouth 3 (three) times daily as needed for dizziness. 05/07/24  Yes Enedelia Dorna HERO, FNP  ondansetron  (ZOFRAN -ODT) 4 MG disintegrating tablet Take 1 tablet (4 mg total) by mouth every 8 (eight) hours as needed for nausea or vomiting. 05/07/24  Yes Enedelia Dorna HERO, FNP  oseltamivir  (TAMIFLU ) 75 MG capsule Take 1 capsule (75 mg total) by mouth every 12 (twelve) hours. 05/07/24  Yes Enedelia Dorna HERO, FNP  promethazine -dextromethorphan (PROMETHAZINE -DM) 6.25-15 MG/5ML syrup Take 5  mLs by mouth at bedtime as needed for cough. 05/07/24  Yes Enedelia Dorna HERO, FNP  fluticasone  (FLONASE ) 50 MCG/ACT nasal spray Place 2 sprays into both nostrils daily. Shake well before use. Gently blow nose before spraying. Do not blow nose immediately after use. You should not taste the medication or feel it going down your throat; if you do, adjust your technique. 11/07/23   Murrill, Samantha, FNP  hydroxychloroquine (PLAQUENIL) 200 MG tablet take 2 tablets by mouth once daily WITH FOOD OR MILK    [provider]  MELATONIN PO Take by mouth at bedtime as needed. Patient not taking: Reported on 07/23/2023    [provider]  naproxen  (NAPROSYN ) 500 MG tablet Take 1 tablet (500 mg total) by mouth 2 (two) times daily with a meal. 11/07/23   Iola Lukes, FNP    Family History Family History  Problem Relation Age of Onset   Lupus Mother    High Cholesterol Mother    Diabetes Father    Healthy Daughter    Healthy Son    Bipolar disorder Maternal Uncle    Bipolar disorder Maternal Grandmother    Schizophrenia Maternal Grandmother    Bipolar disorder Cousin    Colon cancer Neg Hx    Esophageal cancer Neg Hx    Rectal cancer Neg Hx    Stomach cancer Neg Hx     Social History Social History[1]   Allergies   Latex, Mangifera indica, Mango flavoring agent (non-screening), and Strawberry extract   Review of Systems Review of Systems  Gastrointestinal:  Positive for vomiting.  Neurological:  Positive for dizziness.  Per HPI   Physical Exam Triage Vital Signs ED Triage Vitals  Encounter Vitals Group     BP 05/07/24 0905 (!) 131/97     Girls Systolic BP Percentile --      Girls Diastolic BP Percentile --      Boys Systolic BP Percentile --      Boys Diastolic BP Percentile --      Pulse Rate 05/07/24 0905 (!) 107     Resp 05/07/24 0905 20     Temp 05/07/24 0905 99.1 F (37.3 C)     Temp src --      SpO2 05/07/24 0905 97 %     Weight --      Height  --      Head Circumference --      Peak Flow --      Pain Score 05/07/24 0902 0     Pain Loc --      Pain Education --      Exclude from Growth Chart --    Orthostatic VS for the past 24 hrs:  BP- Lying Pulse- Lying BP- Sitting Pulse- Sitting BP- Standing at 0 minutes Pulse- Standing at 0 minutes  05/07/24 0944 117/83 90 113/77 111 117/84 110    Updated Vital  Signs BP (!) 131/97   Pulse (!) 107   Temp 99.1 F (37.3 C)   Resp 20   LMP 04/29/2024   SpO2 97%   Visual Acuity Right Eye Distance:   Left Eye Distance:   Bilateral Distance:    Right Eye Near:   Left Eye Near:    Bilateral Near:     Physical Exam Vitals and nursing note reviewed.  Constitutional:      Appearance: She is not ill-appearing or toxic-appearing.  HENT:     Head: Normocephalic and atraumatic.     Right Ear: Hearing, tympanic membrane, ear canal and external ear normal.     Left Ear: Hearing, tympanic membrane, ear canal and external ear normal.     Nose: Congestion present.     Mouth/Throat:     Lips: Pink.     Mouth: Mucous membranes are moist. No injury or oral lesions.     Dentition: Normal dentition.     Tongue: No lesions.     Pharynx: Oropharynx is clear. Uvula midline. Posterior oropharyngeal erythema present. No pharyngeal swelling, oropharyngeal exudate, uvula swelling or postnasal drip.     Tonsils: No tonsillar exudate.  Eyes:     General: Lids are normal. Vision grossly intact. Gaze aligned appropriately.     Extraocular Movements: Extraocular movements intact.     Conjunctiva/sclera: Conjunctivae normal.  Neck:     Trachea: Trachea and phonation normal.  Cardiovascular:     Rate and Rhythm: Normal rate and regular rhythm.     Heart sounds: Normal heart sounds, S1 normal and S2 normal.  Pulmonary:     Effort: Pulmonary effort is normal. No respiratory distress.     Breath sounds: Normal air entry. No wheezing, rhonchi or rales.  Chest:     Chest wall: No tenderness.   Abdominal:     General: Abdomen is flat. Bowel sounds are normal.     Palpations: Abdomen is soft.     Tenderness: There is no abdominal tenderness. There is no guarding.  Musculoskeletal:     Cervical back: Neck supple.     Right lower leg: No edema.     Left lower leg: No edema.  Lymphadenopathy:     Cervical: No cervical adenopathy.  Skin:    General: Skin is warm and dry.     Capillary Refill: Capillary refill takes less than 2 seconds.     Findings: No rash.  Neurological:     General: No focal deficit present.     Mental Status: She is alert and oriented to person, place, and time. Mental status is at baseline.     GCS: GCS eye subscore is 4. GCS verbal subscore is 5. GCS motor subscore is 6.     Cranial Nerves: Cranial nerves 2-12 are intact. No dysarthria or facial asymmetry.     Sensory: Sensation is intact.     Motor: Motor function is intact. No weakness, tremor, abnormal muscle tone or pronator drift.     Coordination: Coordination is intact. Romberg sign negative. Coordination normal. Finger-Nose-Finger Test normal.     Gait: Gait is intact.     Comments: Strength and sensation intact to bilateral upper and lower extremities (5/5). Moves all 4 extremities with normal coordination voluntarily. Non-focal neuro exam.   Psychiatric:        Mood and Affect: Mood normal.        Speech: Speech normal.        Behavior: Behavior normal.  Thought Content: Thought content normal.        Judgment: Judgment normal.      UC Treatments / Results  Labs (all labs ordered are listed, but only abnormal results are displayed) Labs Reviewed  POCT INFLUENZA A/B - Abnormal; Notable for the following components:      Result Value   Influenza B, POC Positive (*)    All other components within normal limits  CBC  BASIC METABOLIC PANEL WITH GFR    EKG   Radiology No results found.  Procedures Procedures (including critical care time)  Medications Ordered in  UC Medications - No data to display  Initial Impression / Assessment and Plan / UC Course  I have reviewed the triage vital signs and the nursing notes.  Pertinent labs & imaging results that were available during my care of the patient were reviewed by me and considered in my medical decision making (see chart for details).   1.  Influenza B, nausea and vomiting Flu B point of care testing positive. Lungs clear, vitals hemodynamically stable, therefore deferred imaging. Offered antiviral given timing of illness, Tamiflu  sent to pharmacy.  Recommend supportive care for further symptomatic relief as outlined in AVS.  Modes of transmission, quarantine recommendations, and hand hygiene discussed.  2.  Benign paroxysmal positional vertigo of right ear, dizziness, menorrhagia with regular cycle High clinical suspicion for vertigo causes of dizziness versus menorrhagia with irregular cycle.  Low suspicion for acute intracranial abnormality.  Neuroexam is nonfocal and intact to baseline. Orthostatic vital signs show orthostatic tachycardia from lying to sitting from 90 bpm lying to 113 bpm standing. She is currently tolerating oral liquids and is a candidate for rehydration orally with help from Zofran . Suspect mild dehydration is contributing to her dizziness/vertigo. CBC is pending to evaluate for iron deficiency anemia signs contributing to dizziness. Staff will call if blood work is abnormal. Basic metabolic panel pending to evaluate for electrolyte imbalances contributing to dizziness as well. Encouraged to push fluids at home and eat bland diet until she is able to return to normal diet with help from antiemetics. Follow-up with PCP in the next 3 to 4 days for recheck or sooner if symptoms fail to improve.  Counseled patient on potential for adverse effects with medications prescribed/recommended today, strict ER and return-to-clinic precautions discussed, patient verbalized understanding.     Final Clinical Impressions(s) / UC Diagnoses   Final diagnoses:  Influenza B  Benign paroxysmal positional vertigo of right ear  Dizziness  Menorrhagia with regular cycle  Nausea and vomiting, unspecified vomiting type     Discharge Instructions      You have been evaluated today for dizziness. Your evaluation suggests that your symptoms are most likely due to vertigo and dehydration.  I have also checked your blood for anemia, this can cause dizziness due to losing too much blood when you have your menstrual cycle.  Blood work will come back in 24-48 hours and staff will call you if your blood work is abnormal.   You have been prescribed meclizine  to help relieve your symptoms. This medicine can make you sleepy, so do not take this and drive, drink alcohol, or go to work. Please take your prescription as directed.   Please follow up with your primary care provider for further management. Drink plenty of water to stay well hydrated.  You also have the FLU! Flu B.  Take Tamiflu  every 12 hours for the next 5 days to improve symptoms and stop  the virus from replicating in your body. Ibuprofen /tylenol as needed for fevers and body aches. Mucinex  as needed for nasal congestion. Zofran  as needed for nausea/vomiting. Take promethazine  DM cough syrup at bedtime as needed for coughing.  Only take this at bedtime as it will make you sleepy.  If your symptoms do not improve in the next 2-3 days with interventions, please return. Return immediately for worsening or uncontrolled symptoms, worsening headache, chest pain, shortness of breath, persistent vomiting, vision changes, fainting, or for any other concerning symptoms.  I hope you feel better!     ED Prescriptions     Medication Sig Dispense Auth. Provider   oseltamivir  (TAMIFLU ) 75 MG capsule Take 1 capsule (75 mg total) by mouth every 12 (twelve) hours. 10 capsule Enedelia Dorna HERO, FNP   ondansetron  (ZOFRAN -ODT) 4 MG  disintegrating tablet Take 1 tablet (4 mg total) by mouth every 8 (eight) hours as needed for nausea or vomiting. 20 tablet Eyvette Cordon M, FNP   meclizine  (ANTIVERT ) 12.5 MG tablet Take 1 tablet (12.5 mg total) by mouth 3 (three) times daily as needed for dizziness. 30 tablet Kensington Rios M, FNP   guaiFENesin  (MUCINEX ) 600 MG 12 hr tablet Take 1 tablet (600 mg total) by mouth 2 (two) times daily. 30 tablet Haadi Santellan M, FNP   promethazine -dextromethorphan (PROMETHAZINE -DM) 6.25-15 MG/5ML syrup Take 5 mLs by mouth at bedtime as needed for cough. 118 mL Enedelia Dorna HERO, FNP      PDMP not reviewed this encounter.    [1]  Social History Tobacco Use   Smoking status: Every Day    Current packs/day: 0.25    Average packs/day: 0.3 packs/day for 10.0 years (2.5 ttl pk-yrs)    Types: Cigarettes    Passive exposure: Current   Smokeless tobacco: Never  Vaping Use   Vaping status: Never Used  Substance Use Topics   Alcohol use: Yes    Alcohol/week: 0.0 standard drinks of alcohol    Comment: occasional    Drug use: No     Enedelia Dorna HERO, FNP 05/07/24 1105  "

## 2024-05-07 NOTE — ED Triage Notes (Signed)
 PT reports starting over the summer Sx's with her period . Pt Sx's THAT occur are nausea,vomiting and feeling dizzy. The Sx's Pt has today are not new. Pt does report the sx's usally go away when period stops but this time the Sx's have not stopped.

## 2024-05-10 ENCOUNTER — Ambulatory Visit (HOSPITAL_COMMUNITY): Payer: Self-pay

## 2024-06-11 ENCOUNTER — Ambulatory Visit
Admission: EM | Admit: 2024-06-11 | Discharge: 2024-06-11 | Disposition: A | Discharge status: Home / Self Care | Attending: Family Medicine | Admitting: Family Medicine

## 2024-06-11 ENCOUNTER — Encounter: Payer: Self-pay | Admitting: Emergency Medicine

## 2024-06-11 DIAGNOSIS — G5603 Carpal tunnel syndrome, bilateral upper limbs: Secondary | ICD-10-CM

## 2024-06-11 MED ORDER — METHYLPREDNISOLONE SODIUM SUCC 125 MG IJ SOLR
60.0000 mg | Freq: Once | INTRAMUSCULAR | Status: AC
  Administered 2024-06-11: 60 mg via INTRAMUSCULAR

## 2024-06-11 MED ORDER — METHYLPREDNISOLONE 4 MG PO TBPK: ORAL_TABLET | ORAL | 0 refills | Status: AC

## 2024-06-11 NOTE — ED Triage Notes (Signed)
 Patient presents for bilateral hand pain x 1 day.  Patient stated she has carpel tunnel so it hurt off and on however since yesterday it has gotten worst.  Patient has been taken ibphofen.

## 2024-06-11 NOTE — Discharge Instructions (Signed)
 You were given a steroid shot in the clinic.  Continue your wrist braces as needed.  Start Medrol  Dosepak tomorrow, 1/31.  I highly recommend you follow-up with orthopedics regarding your carpal tunnel for further treatment options.  Please go to the ER for any worsening symptoms.  Hope you feel better soon!
